# Patient Record
Sex: Male | Born: 1960 | Race: White | Hispanic: No | Smoking: Heavy tobacco smoker
Health system: Southern US, Community
[De-identification: ages and names within clinical notes are randomized; demographics above are authoritative.]

## PROBLEM LIST (undated history)

## (undated) DIAGNOSIS — K7460 Unspecified cirrhosis of liver: Secondary | ICD-10-CM

## (undated) HISTORY — PX: LEG AMPUTATION: SHX1105

## (undated) HISTORY — PX: MR LOWER LEG LEFT (ARMC HX): HXRAD1784

---

## 2005-03-16 ENCOUNTER — Ambulatory Visit (HOSPITAL_COMMUNITY): Admission: RE | Admit: 2005-03-16 | Discharge: 2005-03-16 | Payer: Self-pay | Admitting: General Surgery

## 2009-09-04 ENCOUNTER — Encounter (HOSPITAL_COMMUNITY): Admission: RE | Admit: 2009-09-04 | Discharge: 2009-10-04 | Payer: Self-pay

## 2009-10-07 ENCOUNTER — Encounter (HOSPITAL_COMMUNITY): Admission: RE | Admit: 2009-10-07 | Discharge: 2009-11-06 | Payer: Self-pay | Admitting: Orthopedic Surgery

## 2009-11-07 ENCOUNTER — Encounter (HOSPITAL_COMMUNITY): Admission: RE | Admit: 2009-11-07 | Discharge: 2009-12-07 | Payer: Self-pay | Admitting: Orthopedic Surgery

## 2010-01-02 ENCOUNTER — Ambulatory Visit: Payer: Self-pay

## 2010-02-25 ENCOUNTER — Ambulatory Visit: Payer: Self-pay | Admitting: Orthopedic Surgery

## 2010-04-29 ENCOUNTER — Ambulatory Visit: Payer: Self-pay | Admitting: Family Medicine

## 2011-01-02 NOTE — Op Note (Signed)
NAMEQUSAY, VILLADA                   ACCOUNT NO.:  [id number]   MEDICAL RECORD NO.:  [id number]          PATIENT TYPE:  OIB   LOCATION:  2899                         FACILITY:  [facility]   PHYSICIAN:  Cherylynn Ridges, M.D.    DATE OF BIRTH:  09-09-1960   DATE OF PROCEDURE:  03/16/2005  DATE OF DISCHARGE:                                 OPERATIVE REPORT   PREOPERATIVE DIAGNOSIS:  Umbilical hernia.   POSTOPERATIVE DIAGNOSIS:  Umbilical hernia.   OPERATION/PROCEDURE:  Repair of umbilical hernia with mesh.   SURGEON:  Cherylynn Ridges, M.D.   ASSISTANT:  None.   ANESTHESIA:  General endotracheal anesthesia.   ESTIMATED BLOOD LOSS:  Less than 20 mL.   COMPLICATIONS:  None.   CONDITION:  Stable.   FINDINGS:  The patient had a 2 cm umbilical hernia defect with incarcerated  parietal peritoneal fat.   DISPOSITION:  To PACU, then to home when stable.   INDICATIONS:  The patient is a 50 year old gentleman with a symptomatic  umbilical hernia who now comes in for repair.   DESCRIPTION OF PROCEDURE:  The patient was taken to the operating room,  placed on the table in the supine position.  After an adequate endotracheal  anesthetic was administered, he was prepped and draped in the usual sterile  manner exposing the periumbilical area.   A supraumbilical curvilinear incision was made using #10 blade, taken down  to the subcutaneous tissue.  We then bluntly and sharply dissected off the  hernia defect in the supraumbilical area down to its fascial edge.  This was  done using electrocautery and also Metzenbaum scissors.  Once the edge was  defined, we opened the sac and resected the hernia sac itself, cauterizing  its edge so that it did not bleed significantly.  We then repaired the 2 cm  fascial defect using a running #1 Ethibond suture which came from left to  right across the defect and then back from right to left, tying it to itself  on the left side.  We irrigated the wound with  antibiotic solution and then  we placed a folded piece of  3 x 6 mesh in the wound, attaching it with #1 Prolene.  Excess mesh was  removed with scissors.  We irrigated again with antibiotic solution.  Then  we reapproximated the subcutaneous tissue on top of the mesh using  interrupted 3-0  Vicryl sutures.  Once this was done, we injected the subcutaneous area using  0.5% Marcaine.  A total of 16 mL were used.  We then closed the skin using a  running subcuticular stitch of 4-0 Monocryl.  A sterile dressing was  applied.  Needle count, sponge count and instrument count were correct.      Cherylynn Ridges, M.D.  Electronically Signed     JOW/MEDQ  D:  03/16/2005  T:  03/16/2005  Job:  [id number]

## 2011-01-13 ENCOUNTER — Other Ambulatory Visit: Payer: Self-pay | Admitting: *Deleted

## 2011-01-13 MED ORDER — DILTIAZEM HCL ER BEADS 180 MG PO CP24: 180.0000 mg | ORAL_CAPSULE | Freq: Every day | ORAL | Status: DC

## 2011-02-23 ENCOUNTER — Other Ambulatory Visit: Payer: Self-pay | Admitting: Cardiology

## 2011-07-09 IMAGING — US US EXTREM LOW VENOUS*R*
1 series · 17 of 24 positions shown · non-contrast
Comparison: none

REASON FOR EXAM: swollen leg eval DVT   CR  [phone]
COMMENTS:

[Series 1: us extrem low venous*right* · 17 of 25 slices shown]
[im 1/25]
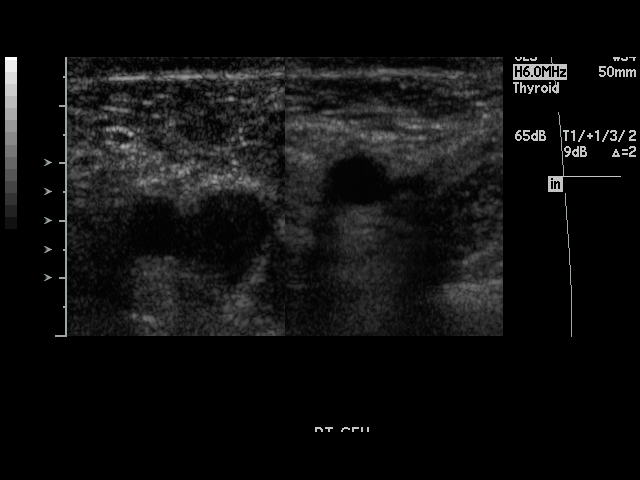
[im 3/25]
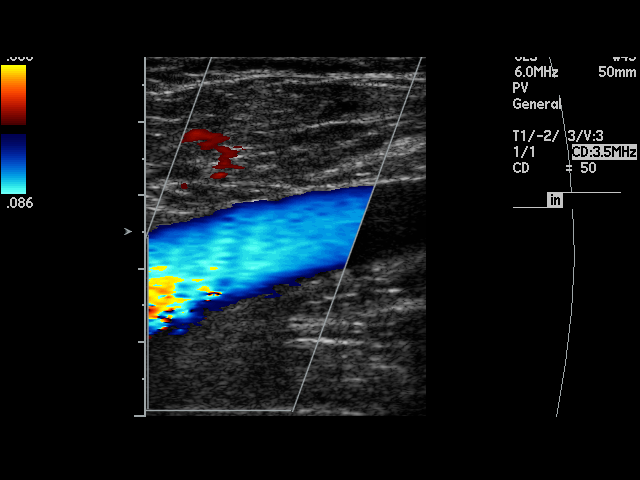
[im 4/25]
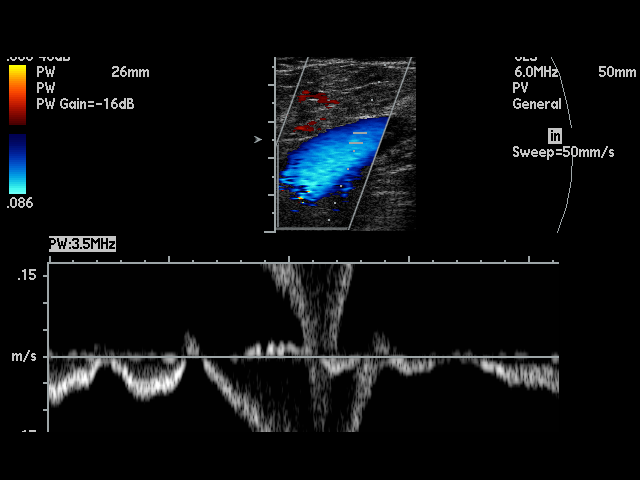
[im 5/25]
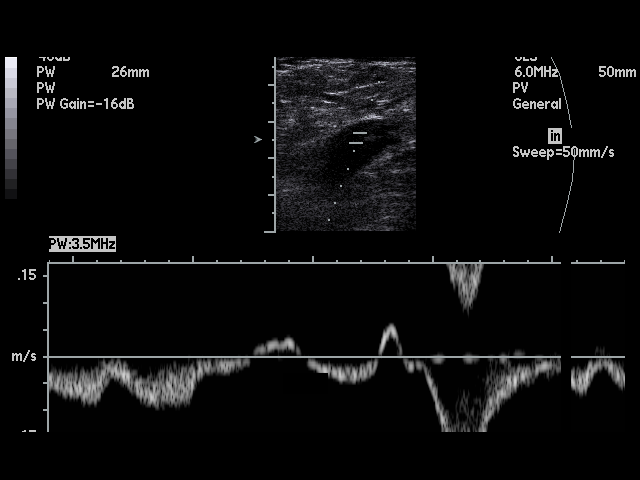
[im 7/25]
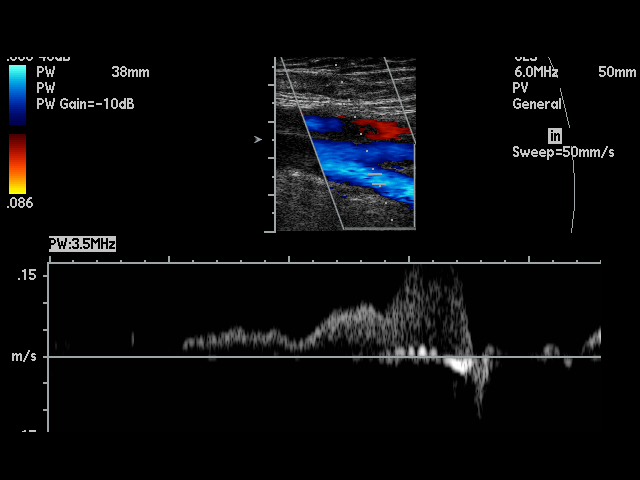
[im 8/25]
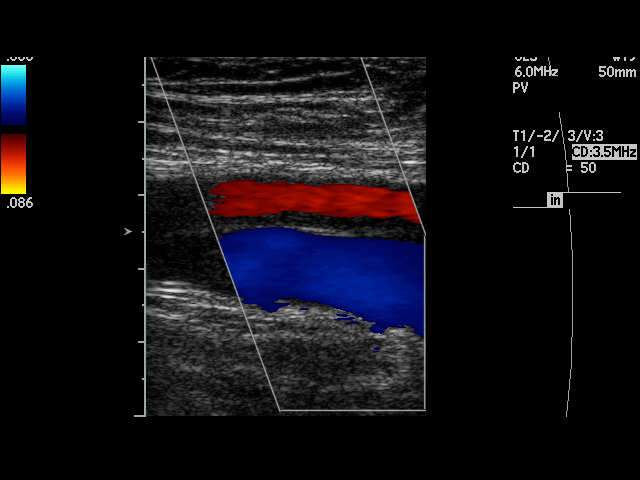
[im 10/25]
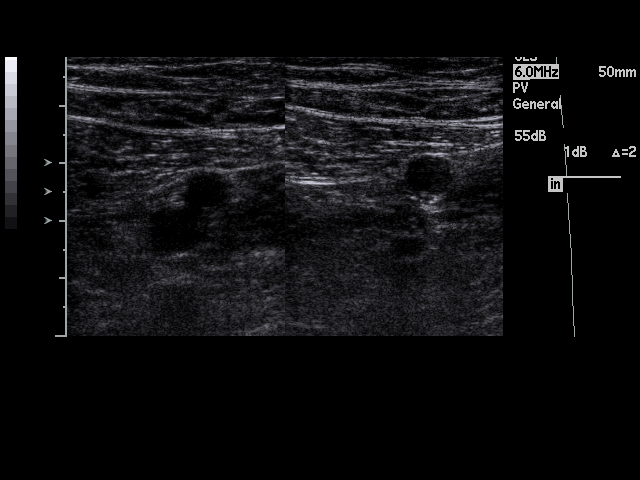
[im 11/25]
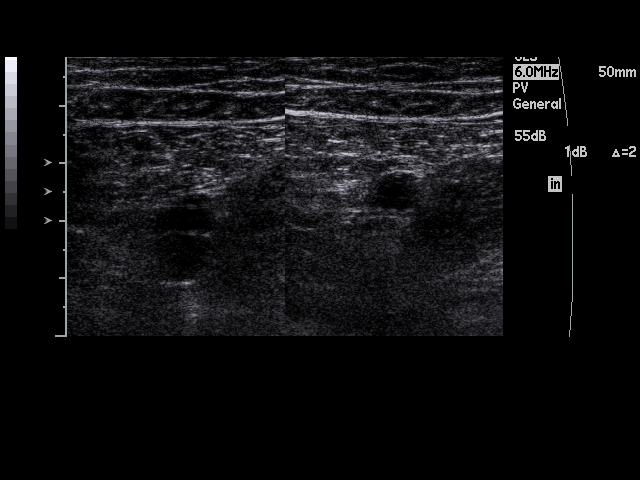
[im 13/25]
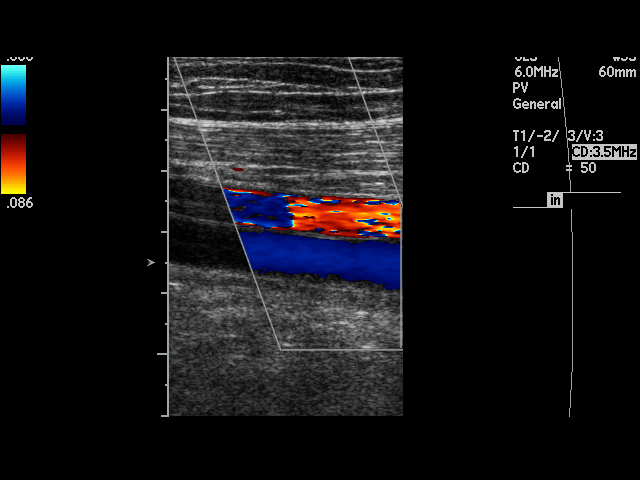
[im 14/25]
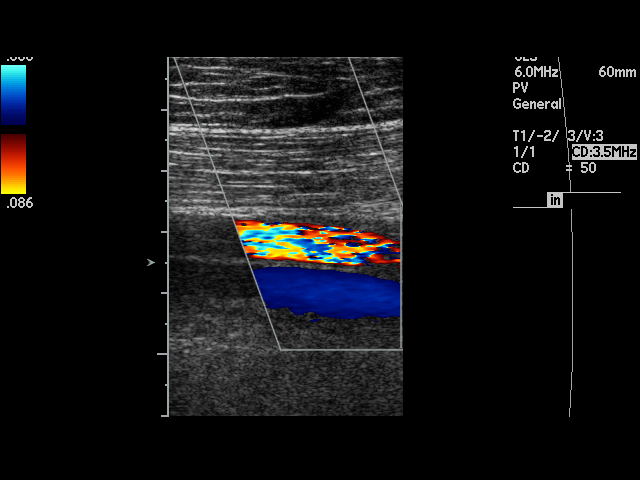
[im 15/25]
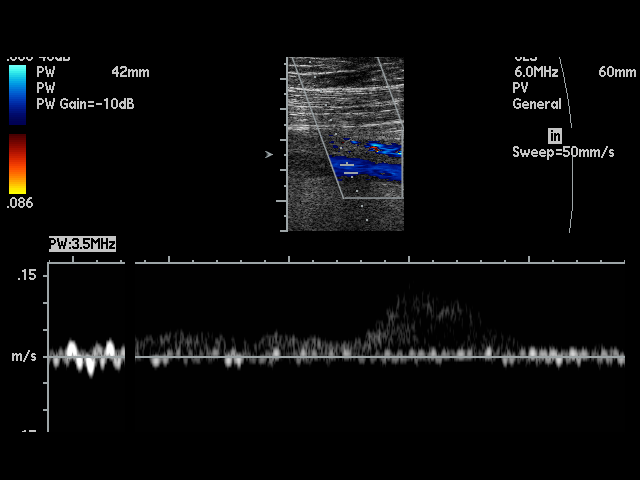
[im 17/25]
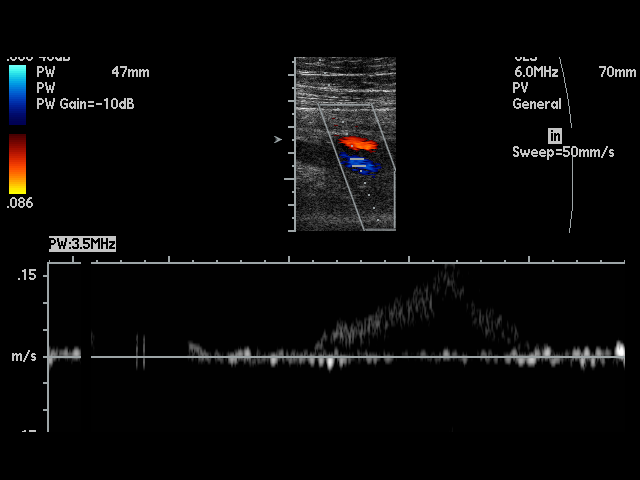
[im 18/25]
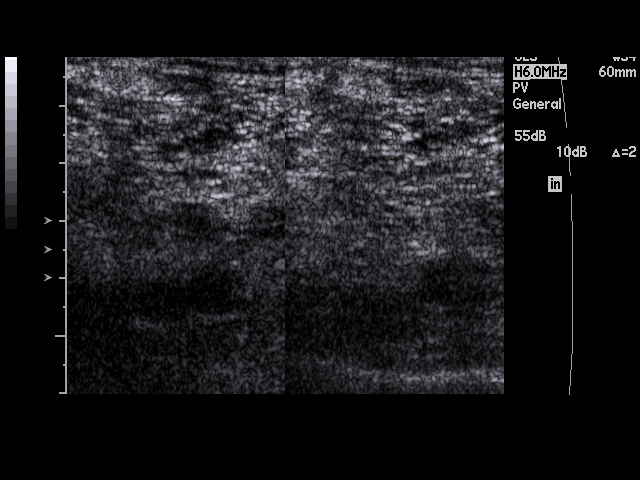
[im 20/25]
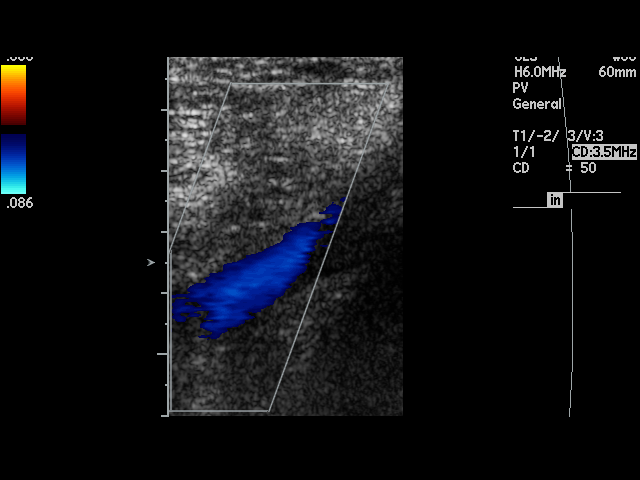
[im 21/25]
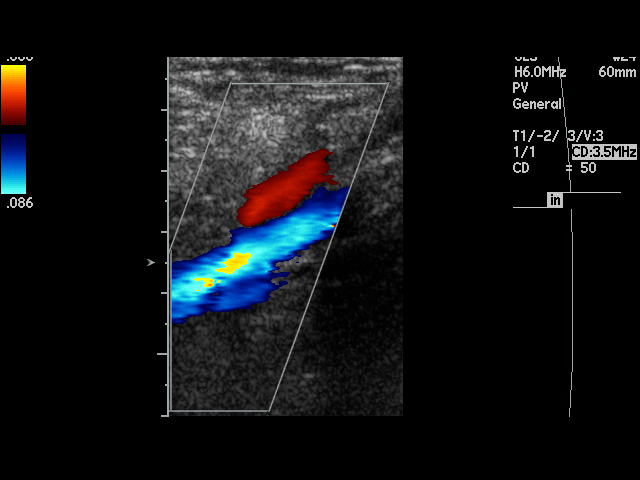
[im 22/25]
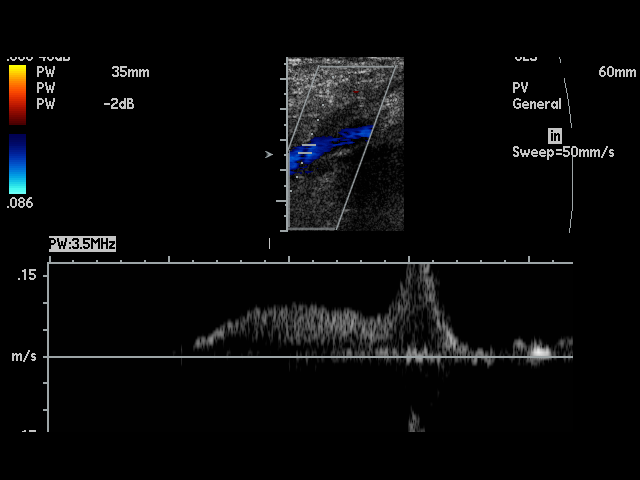
[im 25/25]
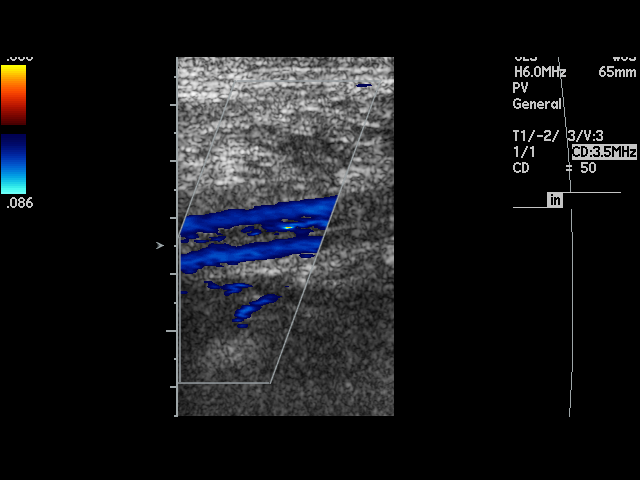

[17 of 24 positions shown; findings below may reference images not displayed]

PROCEDURE:     BAMBUCAFE - BAMBUCAFE DOPPLER LOW EXTR RIGHT  - April 29, 2010 [DATE]

RESULT:     Right lower extremity duplex Doppler interrogation demonstrates
normal compressibility from the common femoral vein to the popliteal veins.
Color Doppler and spectral Doppler appearance is normal. There is normal
response to Valsalva and calf compression.
IMPRESSION: No evidence of right lower extremity deep vein thrombosis.

## 2017-02-24 ENCOUNTER — Emergency Department (HOSPITAL_COMMUNITY): Payer: Medicaid Other

## 2017-02-24 ENCOUNTER — Encounter (HOSPITAL_COMMUNITY): Payer: Self-pay | Admitting: Emergency Medicine

## 2017-02-24 ENCOUNTER — Emergency Department (HOSPITAL_COMMUNITY)
Admission: EM | Admit: 2017-02-24 | Discharge: 2017-02-24 | Disposition: A | Discharge status: Home / Self Care | Payer: Medicaid Other | Attending: Emergency Medicine | Admitting: Emergency Medicine

## 2017-02-24 DIAGNOSIS — M545 Low back pain: Secondary | ICD-10-CM | POA: Diagnosis not present

## 2017-02-24 DIAGNOSIS — I10 Essential (primary) hypertension: Secondary | ICD-10-CM | POA: Diagnosis not present

## 2017-02-24 DIAGNOSIS — F17210 Nicotine dependence, cigarettes, uncomplicated: Secondary | ICD-10-CM | POA: Diagnosis not present

## 2017-02-24 DIAGNOSIS — G8929 Other chronic pain: Secondary | ICD-10-CM

## 2017-02-24 MED ORDER — HYDROCODONE-ACETAMINOPHEN 5-325 MG PO TABS: 1.0000 | ORAL_TABLET | ORAL | 0 refills | Status: DC | PRN

## 2017-02-24 MED ORDER — PREDNISONE 10 MG PO TABS: ORAL_TABLET | ORAL | 0 refills | Status: DC

## 2017-02-24 MED ORDER — LISINOPRIL 10 MG PO TABS: 10.0000 mg | ORAL_TABLET | Freq: Every day | ORAL | 1 refills | Status: DC

## 2017-02-24 MED ORDER — ONDANSETRON 4 MG PO TBDP
4.0000 mg | ORAL_TABLET | Freq: Once | ORAL | Status: AC
Start: 2017-02-24 — End: 2017-02-24
  Administered 2017-02-24: 4 mg via ORAL
  Filled 2017-02-24: qty 1

## 2017-02-24 MED ORDER — PREDNISONE 50 MG PO TABS
60.0000 mg | ORAL_TABLET | Freq: Once | ORAL | Status: AC
  Administered 2017-02-24: 60 mg via ORAL
  Filled 2017-02-24: qty 1

## 2017-02-24 MED ORDER — HYDROMORPHONE HCL 1 MG/ML IJ SOLN
1.0000 mg | Freq: Once | INTRAMUSCULAR | Status: AC
  Administered 2017-02-24: 1 mg via INTRAMUSCULAR
  Filled 2017-02-24: qty 1

## 2017-02-24 MED ORDER — CYCLOBENZAPRINE HCL 10 MG PO TABS: 10.0000 mg | ORAL_TABLET | Freq: Two times a day (BID) | ORAL | 0 refills | Status: DC | PRN

## 2017-02-24 NOTE — ED Triage Notes (Signed)
Patient complains of left lower back pain. Mr. Henry [name] state that he "blew out" the disc on his left lower back.
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
## 2017-02-24 NOTE — Discharge Instructions (Signed)
Take your next dose of prednisone tomorrow evening.  Use the the other medicines as directed.  Do not drive within 4 hours of taking hydrocodone as this will make you drowsy.  Avoid lifting,  Bending,  Twisting or any other activity that worsens your pain over the next week.  Apply an  icepack  to your lower back for 10-15 minutes every 2 hours for the next 2 days.  You should get rechecked if your symptoms are not better over the next 5 days,  Or you develop increased pain,  Weakness in your leg(s) or loss of bladder or bowel function - these are symptoms of a worse injury.

## 2019-05-05 ENCOUNTER — Other Ambulatory Visit: Payer: Self-pay

## 2019-05-05 ENCOUNTER — Emergency Department (HOSPITAL_COMMUNITY): Payer: Medicaid Other

## 2019-05-05 ENCOUNTER — Inpatient Hospital Stay (HOSPITAL_COMMUNITY)
Admission: EM | Admit: 2019-05-05 | Discharge: 2019-05-09 | DRG: 432 | Disposition: A | Discharge status: Home / Self Care | Payer: Medicaid Other | Attending: Internal Medicine | Admitting: Internal Medicine

## 2019-05-05 ENCOUNTER — Encounter (HOSPITAL_COMMUNITY): Payer: Self-pay | Admitting: Emergency Medicine

## 2019-05-05 DIAGNOSIS — K7031 Alcoholic cirrhosis of liver with ascites: Secondary | ICD-10-CM

## 2019-05-05 DIAGNOSIS — K7040 Alcoholic hepatic failure without coma: Principal | ICD-10-CM | POA: Diagnosis present

## 2019-05-05 DIAGNOSIS — I10 Essential (primary) hypertension: Secondary | ICD-10-CM | POA: Diagnosis present

## 2019-05-05 DIAGNOSIS — R1011 Right upper quadrant pain: Secondary | ICD-10-CM

## 2019-05-05 DIAGNOSIS — Z9119 Patient's noncompliance with other medical treatment and regimen: Secondary | ICD-10-CM | POA: Diagnosis not present

## 2019-05-05 DIAGNOSIS — E871 Hypo-osmolality and hyponatremia: Secondary | ICD-10-CM | POA: Diagnosis present

## 2019-05-05 DIAGNOSIS — I4892 Unspecified atrial flutter: Secondary | ICD-10-CM

## 2019-05-05 DIAGNOSIS — Y906 Blood alcohol level of 120-199 mg/100 ml: Secondary | ICD-10-CM | POA: Diagnosis present

## 2019-05-05 DIAGNOSIS — R6511 Systemic inflammatory response syndrome (SIRS) of non-infectious origin with acute organ dysfunction: Secondary | ICD-10-CM | POA: Diagnosis not present

## 2019-05-05 DIAGNOSIS — K7011 Alcoholic hepatitis with ascites: Secondary | ICD-10-CM | POA: Diagnosis present

## 2019-05-05 DIAGNOSIS — R140 Abdominal distension (gaseous): Secondary | ICD-10-CM | POA: Diagnosis present

## 2019-05-05 DIAGNOSIS — T8789 Other complications of amputation stump: Secondary | ICD-10-CM | POA: Diagnosis not present

## 2019-05-05 DIAGNOSIS — F17210 Nicotine dependence, cigarettes, uncomplicated: Secondary | ICD-10-CM | POA: Diagnosis present

## 2019-05-05 DIAGNOSIS — Z23 Encounter for immunization: Secondary | ICD-10-CM

## 2019-05-05 DIAGNOSIS — I998 Other disorder of circulatory system: Secondary | ICD-10-CM | POA: Diagnosis not present

## 2019-05-05 DIAGNOSIS — Z20828 Contact with and (suspected) exposure to other viral communicable diseases: Secondary | ICD-10-CM | POA: Diagnosis present

## 2019-05-05 DIAGNOSIS — I484 Atypical atrial flutter: Secondary | ICD-10-CM | POA: Diagnosis present

## 2019-05-05 DIAGNOSIS — R6510 Systemic inflammatory response syndrome (SIRS) of non-infectious origin without acute organ dysfunction: Secondary | ICD-10-CM | POA: Diagnosis not present

## 2019-05-05 DIAGNOSIS — K529 Noninfective gastroenteritis and colitis, unspecified: Secondary | ICD-10-CM | POA: Diagnosis present

## 2019-05-05 DIAGNOSIS — K652 Spontaneous bacterial peritonitis: Secondary | ICD-10-CM | POA: Diagnosis present

## 2019-05-05 DIAGNOSIS — K7460 Unspecified cirrhosis of liver: Secondary | ICD-10-CM

## 2019-05-05 DIAGNOSIS — M7989 Other specified soft tissue disorders: Secondary | ICD-10-CM | POA: Diagnosis present

## 2019-05-05 DIAGNOSIS — E860 Dehydration: Secondary | ICD-10-CM | POA: Diagnosis present

## 2019-05-05 DIAGNOSIS — F1010 Alcohol abuse, uncomplicated: Secondary | ICD-10-CM | POA: Diagnosis present

## 2019-05-05 DIAGNOSIS — R188 Other ascites: Secondary | ICD-10-CM

## 2019-05-05 DIAGNOSIS — R600 Localized edema: Secondary | ICD-10-CM | POA: Diagnosis present

## 2019-05-05 DIAGNOSIS — Z720 Tobacco use: Secondary | ICD-10-CM

## 2019-05-05 HISTORY — DX: Rider (driver) (passenger) of other motorcycle injured in unspecified traffic accident, initial encounter: V29.99XA

## 2019-05-05 LAB — URINALYSIS, ROUTINE W REFLEX MICROSCOPIC
Glucose, UA: NEGATIVE mg/dL
Hgb urine dipstick: NEGATIVE
Ketones, ur: NEGATIVE mg/dL
Leukocytes,Ua: NEGATIVE
Nitrite: NEGATIVE
Protein, ur: NEGATIVE mg/dL
Specific Gravity, Urine: >1.046 — ABNORMAL HIGH (ref 1.005–1.030)
pH: 6 (ref 5.0–8.0)

## 2019-05-05 LAB — COMPREHENSIVE METABOLIC PANEL
ALT: 76 U/L — ABNORMAL HIGH (ref 0–44)
AST: 219 U/L — ABNORMAL HIGH (ref 15–41)
Albumin: 2.7 g/dL — ABNORMAL LOW (ref 3.5–5.0)
Alkaline Phosphatase: 407 U/L — ABNORMAL HIGH (ref 38–126)
Anion gap: 13 (ref 5–15)
BUN: 6 mg/dL (ref 6–20)
CO2: 22 mmol/L (ref 22–32)
Calcium: 8 mg/dL — ABNORMAL LOW (ref 8.9–10.3)
Chloride: 98 mmol/L (ref 98–111)
Creatinine, Ser: 0.56 mg/dL — ABNORMAL LOW (ref 0.61–1.24)
GFR calc Af Amer: >60 mL/min (ref >60)
GFR calc non Af Amer: >60 mL/min (ref >60)
Glucose, Bld: 118 mg/dL — ABNORMAL HIGH (ref 70–99)
Potassium: 3.7 mmol/L (ref 3.5–5.1)
Sodium: 133 mmol/L — ABNORMAL LOW (ref 135–145)
Total Bilirubin: 4.8 mg/dL — ABNORMAL HIGH (ref 0.3–1.2)
Total Protein: 7.3 g/dL (ref 6.5–8.1)

## 2019-05-05 LAB — CBC WITH DIFFERENTIAL/PLATELET
Abs Immature Granulocytes: 0.04 10*3/uL (ref 0.00–0.07)
Basophils Absolute: 0.1 10*3/uL (ref 0.0–0.1)
Basophils Relative: 1 %
Eosinophils Absolute: 0 10*3/uL (ref 0.0–0.5)
Eosinophils Relative: 0 %
HCT: 37.3 % — ABNORMAL LOW (ref 39.0–52.0)
Hemoglobin: 13.3 g/dL (ref 13.0–17.0)
Immature Granulocytes: 0 %
Lymphocytes Relative: 17 %
Lymphs Abs: 2 10*3/uL (ref 0.7–4.0)
MCH: 39.7 pg — ABNORMAL HIGH (ref 26.0–34.0)
MCHC: 35.7 g/dL (ref 30.0–36.0)
MCV: 111.3 fL — ABNORMAL HIGH (ref 80.0–100.0)
Monocytes Absolute: 0.9 10*3/uL (ref 0.1–1.0)
Monocytes Relative: 7 %
Neutro Abs: 9.2 10*3/uL — ABNORMAL HIGH (ref 1.7–7.7)
Neutrophils Relative %: 75 %
Platelets: 223 10*3/uL (ref 150–400)
RBC: 3.35 MIL/uL — ABNORMAL LOW (ref 4.22–5.81)
RDW: 18.3 % — ABNORMAL HIGH (ref 11.5–15.5)
WBC: 12.3 10*3/uL — ABNORMAL HIGH (ref 4.0–10.5)
nRBC: 0.3 % — ABNORMAL HIGH (ref 0.0–0.2)

## 2019-05-05 LAB — SALICYLATE LEVEL: Salicylate Lvl: <7 mg/dL (ref 2.8–30.0)

## 2019-05-05 LAB — AMMONIA: Ammonia: 59 umol/L — ABNORMAL HIGH (ref 9–35)

## 2019-05-05 LAB — ACETAMINOPHEN LEVEL: Acetaminophen (Tylenol), Serum: <10 ug/mL — ABNORMAL LOW (ref 10–30)

## 2019-05-05 LAB — ETHANOL: Alcohol, Ethyl (B): 173 mg/dL — ABNORMAL HIGH (ref <10)

## 2019-05-05 LAB — HEPARIN LEVEL (UNFRACTIONATED): Heparin Unfractionated: 0.16 IU/mL — ABNORMAL LOW (ref 0.30–0.70)

## 2019-05-05 LAB — PROTIME-INR
INR: 1.3 — ABNORMAL HIGH (ref 0.8–1.2)
Prothrombin Time: 16.2 seconds — ABNORMAL HIGH (ref 11.4–15.2)

## 2019-05-05 LAB — LIPASE, BLOOD: Lipase: 37 U/L (ref 11–51)

## 2019-05-05 LAB — LACTIC ACID, PLASMA
Lactic Acid, Venous: 4 mmol/L (ref 0.5–1.9)
Lactic Acid, Venous: 3.9 mmol/L (ref 0.5–1.9)

## 2019-05-05 MED ORDER — LORAZEPAM 2 MG/ML IJ SOLN
1.0000 mg | INTRAMUSCULAR | Status: AC | PRN
  Administered 2019-05-07: 1 mg via INTRAVENOUS

## 2019-05-05 MED ORDER — LORAZEPAM 1 MG PO TABS: 1.0000 mg | ORAL_TABLET | ORAL | Status: AC | PRN

## 2019-05-05 MED ORDER — LORAZEPAM 2 MG/ML IJ SOLN
0.0000 mg | INTRAMUSCULAR | Status: AC
  Administered 2019-05-05 – 2019-05-06 (×4): 1 mg via INTRAVENOUS
  Filled 2019-05-05 (×4): qty 1

## 2019-05-05 MED ORDER — IOHEXOL 300 MG/ML  SOLN
100.0000 mL | Freq: Once | INTRAMUSCULAR | Status: AC | PRN
  Administered 2019-05-05: 100 mL via INTRAVENOUS

## 2019-05-05 MED ORDER — HEPARIN BOLUS VIA INFUSION
4000.0000 [IU] | Freq: Once | INTRAVENOUS | Status: AC
  Administered 2019-05-05: 4000 [IU] via INTRAVENOUS

## 2019-05-05 MED ORDER — ADULT MULTIVITAMIN W/MINERALS CH
1.0000 | ORAL_TABLET | Freq: Every day | ORAL | Status: DC
  Administered 2019-05-05 – 2019-05-09 (×5): 1 via ORAL
  Filled 2019-05-05 (×5): qty 1

## 2019-05-05 MED ORDER — AMIODARONE IV BOLUS ONLY 150 MG/100ML
150.0000 mg | Freq: Once | INTRAVENOUS | Status: AC
  Administered 2019-05-05: 150 mg via INTRAVENOUS
  Filled 2019-05-05: qty 100

## 2019-05-05 MED ORDER — SODIUM CHLORIDE 0.9 % IV SOLN
2.0000 g | Freq: Three times a day (TID) | INTRAVENOUS | Status: DC
  Administered 2019-05-05 – 2019-05-06 (×2): 2 g via INTRAVENOUS
  Filled 2019-05-05 (×2): qty 2

## 2019-05-05 MED ORDER — VITAMIN B-1 100 MG PO TABS
100.0000 mg | ORAL_TABLET | Freq: Every day | ORAL | Status: DC
  Administered 2019-05-05 – 2019-05-09 (×5): 100 mg via ORAL
  Filled 2019-05-05 (×5): qty 1

## 2019-05-05 MED ORDER — DILTIAZEM LOAD VIA INFUSION
10.0000 mg | Freq: Once | INTRAVENOUS | Status: AC
  Administered 2019-05-05: 10 mg via INTRAVENOUS
  Filled 2019-05-05: qty 10

## 2019-05-05 MED ORDER — VANCOMYCIN HCL IN DEXTROSE 750-5 MG/150ML-% IV SOLN
750.0000 mg | Freq: Three times a day (TID) | INTRAVENOUS | Status: DC
  Administered 2019-05-06 (×2): 750 mg via INTRAVENOUS
  Filled 2019-05-05 (×2): qty 150

## 2019-05-05 MED ORDER — SODIUM CHLORIDE 0.9 % IV BOLUS
500.0000 mL | Freq: Once | INTRAVENOUS | Status: AC
  Administered 2019-05-05: 500 mL via INTRAVENOUS

## 2019-05-05 MED ORDER — DILTIAZEM HCL 100 MG IV SOLR
5.0000 mg/h | INTRAVENOUS | Status: DC
  Administered 2019-05-05: 15 mg/h via INTRAVENOUS
  Administered 2019-05-05: 5 mg/h via INTRAVENOUS
  Administered 2019-05-06: 15 mg/h via INTRAVENOUS
  Filled 2019-05-05 (×4): qty 100

## 2019-05-05 MED ORDER — THIAMINE HCL 100 MG/ML IJ SOLN: 100.0000 mg | Freq: Every day | INTRAMUSCULAR | Status: DC

## 2019-05-05 MED ORDER — HEPARIN (PORCINE) 25000 UT/250ML-% IV SOLN
1300.0000 [IU]/h | INTRAVENOUS | Status: DC
  Administered 2019-05-05: 1100 [IU]/h via INTRAVENOUS
  Filled 2019-05-05 (×2): qty 250

## 2019-05-05 MED ORDER — VANCOMYCIN HCL IN DEXTROSE 1-5 GM/200ML-% IV SOLN: 1000.0000 mg | Freq: Once | INTRAVENOUS | Status: DC

## 2019-05-05 MED ORDER — ONDANSETRON HCL 4 MG/2ML IJ SOLN: 4.0000 mg | Freq: Four times a day (QID) | INTRAMUSCULAR | Status: DC | PRN

## 2019-05-05 MED ORDER — METRONIDAZOLE IN NACL 5-0.79 MG/ML-% IV SOLN
500.0000 mg | Freq: Once | INTRAVENOUS | Status: AC
  Administered 2019-05-05: 500 mg via INTRAVENOUS
  Filled 2019-05-05: qty 100

## 2019-05-05 MED ORDER — LORAZEPAM 2 MG/ML IJ SOLN
0.0000 mg | Freq: Three times a day (TID) | INTRAMUSCULAR | Status: DC
  Filled 2019-05-05 (×2): qty 1

## 2019-05-05 MED ORDER — SODIUM CHLORIDE 0.9 % IV SOLN
2.0000 g | Freq: Once | INTRAVENOUS | Status: AC
  Administered 2019-05-05: 2 g via INTRAVENOUS
  Filled 2019-05-05: qty 2

## 2019-05-05 MED ORDER — HEPARIN BOLUS VIA INFUSION
3000.0000 [IU] | Freq: Once | INTRAVENOUS | Status: AC
  Administered 2019-05-05: 3000 [IU] via INTRAVENOUS

## 2019-05-05 MED ORDER — FOLIC ACID 1 MG PO TABS
1.0000 mg | ORAL_TABLET | Freq: Every day | ORAL | Status: DC
  Administered 2019-05-05 – 2019-05-09 (×5): 1 mg via ORAL
  Filled 2019-05-05 (×4): qty 1

## 2019-05-05 MED ORDER — VANCOMYCIN HCL 1.5 G IV SOLR
1500.0000 mg | Freq: Once | INTRAVENOUS | Status: AC
  Administered 2019-05-05: 1500 mg via INTRAVENOUS
  Filled 2019-05-05: qty 1500

## 2019-05-05 MED ORDER — LACTULOSE 10 GM/15ML PO SOLN
20.0000 g | Freq: Two times a day (BID) | ORAL | Status: DC
  Administered 2019-05-05 – 2019-05-08 (×7): 20 g via ORAL
  Filled 2019-05-05 (×8): qty 30

## 2019-05-05 MED ORDER — ONDANSETRON HCL 4 MG PO TABS: 4.0000 mg | ORAL_TABLET | Freq: Four times a day (QID) | ORAL | Status: DC | PRN

## 2019-05-05 NOTE — Progress Notes (Signed)
Pharmacy Antibiotic Note  Henry [name] is a 58 y.o. male admitted on 05/05/2019 with unknown source of infection.  Pharmacy has been consulted for Vancomycin and cefepime dosing.  Plan: Vancomycin 1500mg  IV loading dose, then 750mg  IV every 8 hours.  Goal trough 15-20 mcg/mL.  Cefepime 2gm IV q8h F/U cxs and clinical progress Monitor V/S, labs and levels as indicated  Height: 5\' 8"  (172.7 cm) Weight: 175 lb (79.4 kg) IBW/kg (Calculated) : 68.4  Temp (24hrs), Avg:98.3 F (36.8 C), Min:98.3 F (36.8 C), Max:98.3 F (36.8 C)  Recent Labs  Lab 05/05/19 1327 05/05/19 1328  WBC 12.3*  --   CREATININE 0.56*  --   LATICACIDVEN  --  4.0*    Estimated Creatinine Clearance: 97.4 mL/min (A) (by C-G formula based on SCr of 0.56 mg/dL (L)).    No Known Allergies  Antimicrobials this admission: Vancomycin 9/18>>  Cefepime 9/18 >>   Dose adjustments this admission: n/a  Microbiology results: 9/18 BCx: pending 9/18 UCx: pending  MRSA PCR:   Thank you for allowing pharmacy to be a part of this patient's care.  Isac Sarna, BS [place], [place] Clinical Pharmacist Pager [phone] 05/05/2019 2:58 PM

## 2019-05-05 NOTE — Progress Notes (Signed)
CSW received call from nursing staff inquiring about pts discharge plan. CSW attempted to contact  [facility] without success in an attempt to obtain caseworker or guardian information to assists with placement process. Staff at [facility] provided [place] with the following contact information: [place]: [phone]. CSW left VM requesting call back.   [place] Transitions [facility] Care  Clinical Social Worker  Ph: [phone]
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
## 2019-05-05 NOTE — ED Notes (Signed)
Patient had bowel movement (diarrhea), patient pooped on bedside commode and the floor.

## 2019-05-05 NOTE — ED Notes (Signed)
Pt transported to CT ?

## 2019-05-05 NOTE — ED Notes (Signed)
CRITICAL VALUE ALERT  Critical Value:  Lactic 4.0  Date & Time Notied:  05/05/2019, 1421 Provider Notified: Dr. Laverta Baltimore  Orders Received/Actions taken: see chart

## 2019-05-05 NOTE — ED Notes (Signed)
Hospitalist aware that first ABT adm prior to blood cultures being drawn
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
## 2019-05-05 NOTE — ED Notes (Signed)
Patient had small bowel movement. Stool looks jelly like and has a foul smell.

## 2019-05-05 NOTE — ED Notes (Signed)
Patient called to use bedside commode, patient had loose watery diarrhea.

## 2019-05-05 NOTE — ED Triage Notes (Addendum)
PT c/o abdominal distention, swelling into his legs and groin worsening over the past 3 weeks. PT states over the past 2-3 days swelling has gotten worsen and now causing some SOB on exertion. PT states he doesn't have  PCP and takes no medications. PT also states increased number of bowel movements and color of stool is a whitish color.

## 2019-05-05 NOTE — Progress Notes (Signed)
ANTICOAGULATION CONSULT NOTE - Follow Up Consult  Pharmacy Consult for heparin Indication: atrial fibrillation  Labs: Recent Labs    05/05/19 1327 05/05/19 2240  HGB 13.3  --   HCT 37.3*  --   PLT 223  --   LABPROT 16.2*  --   INR 1.3*  --   HEPARINUNFRC  --  0.16*  CREATININE 0.56*  --     Assessment: 58yo male subtherapeutic on heparin with initial dosing for Afib; no gtt issues or signs of bleeding per RN.  Goal of Therapy:  Heparin level 0.3-0.7 units/ml   Plan:  Will rebolus with heparin 3000 units and increase heparin gtt by 3 units/kg/hr to 1300 units/hr and check level in 6 hours.    Wynona Neat, PharmD, BCPS  05/05/2019,11:33 PM

## 2019-05-06 ENCOUNTER — Inpatient Hospital Stay (HOSPITAL_COMMUNITY): Payer: Medicaid Other

## 2019-05-06 DIAGNOSIS — I998 Other disorder of circulatory system: Secondary | ICD-10-CM

## 2019-05-06 DIAGNOSIS — F17210 Nicotine dependence, cigarettes, uncomplicated: Secondary | ICD-10-CM

## 2019-05-06 DIAGNOSIS — T8789 Other complications of amputation stump: Secondary | ICD-10-CM

## 2019-05-06 DIAGNOSIS — K652 Spontaneous bacterial peritonitis: Secondary | ICD-10-CM

## 2019-05-06 DIAGNOSIS — R6510 Systemic inflammatory response syndrome (SIRS) of non-infectious origin without acute organ dysfunction: Secondary | ICD-10-CM

## 2019-05-06 LAB — AMYLASE, PLEURAL OR PERITONEAL FLUID: Amylase, Fluid: 10 U/L

## 2019-05-06 LAB — CBC
HCT: 32.8 % — ABNORMAL LOW (ref 39.0–52.0)
Hemoglobin: 11.4 g/dL — ABNORMAL LOW (ref 13.0–17.0)
MCH: 38.5 pg — ABNORMAL HIGH (ref 26.0–34.0)
MCHC: 34.8 g/dL (ref 30.0–36.0)
MCV: 110.8 fL — ABNORMAL HIGH (ref 80.0–100.0)
Platelets: 213 10*3/uL (ref 150–400)
RBC: 2.96 MIL/uL — ABNORMAL LOW (ref 4.22–5.81)
RDW: 18.4 % — ABNORMAL HIGH (ref 11.5–15.5)
WBC: 14.3 10*3/uL — ABNORMAL HIGH (ref 4.0–10.5)
nRBC: 0.1 % (ref 0.0–0.2)

## 2019-05-06 LAB — ALBUMIN, PLEURAL OR PERITONEAL FLUID: Albumin, Fluid: <1 g/dL

## 2019-05-06 LAB — COMPREHENSIVE METABOLIC PANEL
ALT: 64 U/L — ABNORMAL HIGH (ref 0–44)
AST: 183 U/L — ABNORMAL HIGH (ref 15–41)
Albumin: 2.5 g/dL — ABNORMAL LOW (ref 3.5–5.0)
Alkaline Phosphatase: 347 U/L — ABNORMAL HIGH (ref 38–126)
Anion gap: 11 (ref 5–15)
BUN: 7 mg/dL (ref 6–20)
CO2: 22 mmol/L (ref 22–32)
Calcium: 7.8 mg/dL — ABNORMAL LOW (ref 8.9–10.3)
Chloride: 99 mmol/L (ref 98–111)
Creatinine, Ser: 0.61 mg/dL (ref 0.61–1.24)
GFR calc Af Amer: >60 mL/min (ref >60)
GFR calc non Af Amer: >60 mL/min (ref >60)
Glucose, Bld: 126 mg/dL — ABNORMAL HIGH (ref 70–99)
Potassium: 4 mmol/L (ref 3.5–5.1)
Sodium: 132 mmol/L — ABNORMAL LOW (ref 135–145)
Total Bilirubin: 6.6 mg/dL — ABNORMAL HIGH (ref 0.3–1.2)
Total Protein: 6.6 g/dL (ref 6.5–8.1)

## 2019-05-06 LAB — BODY FLUID CELL COUNT WITH DIFFERENTIAL
Eos, Fluid: 0 %
Lymphs, Fluid: 19 %
Monocyte-Macrophage-Serous Fluid: 74 % (ref 50–90)
Neutrophil Count, Fluid: 7 % (ref 0–25)
Total Nucleated Cell Count, Fluid: 209 cu mm (ref 0–1000)

## 2019-05-06 LAB — PROTEIN, PLEURAL OR PERITONEAL FLUID: Total protein, fluid: <3 g/dL

## 2019-05-06 LAB — GLUCOSE, PLEURAL OR PERITONEAL FLUID: Glucose, Fluid: 134 mg/dL

## 2019-05-06 LAB — HEPATITIS PANEL, ACUTE
HCV Ab: 0.1 s/co ratio (ref 0.0–0.9)
Hep A IgM: NEGATIVE
Hep B C IgM: NEGATIVE
Hepatitis B Surface Ag: NEGATIVE

## 2019-05-06 LAB — HEPARIN LEVEL (UNFRACTIONATED)
Heparin Unfractionated: 0.38 IU/mL (ref 0.30–0.70)
Heparin Unfractionated: 0.16 IU/mL — ABNORMAL LOW (ref 0.30–0.70)

## 2019-05-06 LAB — LACTATE DEHYDROGENASE, PLEURAL OR PERITONEAL FLUID: LD, Fluid: <5 U/L (ref 3–23)

## 2019-05-06 LAB — POC OCCULT BLOOD, ED: Fecal Occult Bld: NEGATIVE

## 2019-05-06 LAB — SARS CORONAVIRUS 2 (TAT 6-24 HRS): SARS Coronavirus 2: NEGATIVE

## 2019-05-06 LAB — GLUCOSE, CAPILLARY: Glucose-Capillary: 119 mg/dL — ABNORMAL HIGH (ref 70–99)

## 2019-05-06 MED ORDER — HEPARIN (PORCINE) 25000 UT/250ML-% IV SOLN
1700.0000 [IU]/h | INTRAVENOUS | Status: DC
  Administered 2019-05-07: 05:00:00 1700 [IU]/h via INTRAVENOUS
  Filled 2019-05-06: qty 250

## 2019-05-06 MED ORDER — NICOTINE 21 MG/24HR TD PT24
21.0000 mg | MEDICATED_PATCH | Freq: Every day | TRANSDERMAL | Status: DC
  Administered 2019-05-06 – 2019-05-09 (×4): 21 mg via TRANSDERMAL
  Filled 2019-05-06 (×4): qty 1

## 2019-05-06 MED ORDER — LIDOCAINE HCL (PF) 1 % IJ SOLN
INTRAMUSCULAR | Status: AC
  Filled 2019-05-06: qty 30

## 2019-05-06 MED ORDER — DILTIAZEM HCL 60 MG PO TABS
90.0000 mg | ORAL_TABLET | Freq: Four times a day (QID) | ORAL | Status: AC
  Administered 2019-05-06 – 2019-05-08 (×7): 90 mg via ORAL
  Filled 2019-05-06: qty 2
  Filled 2019-05-06: qty 3
  Filled 2019-05-06 (×5): qty 2

## 2019-05-06 MED ORDER — LEVOFLOXACIN IN D5W 750 MG/150ML IV SOLN
750.0000 mg | INTRAVENOUS | Status: DC
  Administered 2019-05-06: 750 mg via INTRAVENOUS
  Filled 2019-05-06 (×2): qty 150

## 2019-05-06 NOTE — ED Notes (Signed)
Have discontinued Cardizem

## 2019-05-06 NOTE — ED Notes (Signed)
Report to Ayesha Rumpf with [facility].
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
## 2019-05-06 NOTE — Procedures (Signed)
PROCEDURE SUMMARY:  Successful image-guided paracentesis from the left lower abdomen.  Yielded 1.5 liters of clear yellow fluid.  No immediate complications.  EBL: zero Patient tolerated well.   Specimen was sent for labs.  Please see imaging section of Epic for full dictation.  Joaquim Nam PA-C 05/06/2019 3:41 PM

## 2019-05-06 NOTE — Progress Notes (Addendum)
PROGRESS NOTE    Henry [name]  ZCH:[id number] DOB: 07-Aug-1961 DOA: 05/05/2019 PCP: Patient, No Pcp Per     Brief Narrative:  58 y.o. male, with past medical history of hypertension, traumatic left lower extremity amputation, heavy alcohol abuse, he presents to ED secondary to complaints of realized weakness, abdominal pain/distention and swelling for last 3 weeks, as well reports exertional dyspnea, and worsening lower extremity edema, patient reports he drinks 1 pint of whiskey per day, for last few years, patient denies fever, chills, dysuria, polyuria, cough, COVID-19 exposure, diarrhea or constipation, no hematemesis, vomiting or bright red blood per rectum or melena. - in ED patient was noticed to be in a flutter, he did report some palpitation, but denies any chest pain, he was noted to have significant ascites, +2 lower extremity edema, evaded lactic acid at 4, and elevated AST to 19, ALT 76, with total bili of 4.8, CT abdomen pelvis significant for hepatomegaly/steatosis, with ascites, and inflammatory changes appreciated hepatic flexure/cecum.   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 -Stable renal function -No active withdrawal currently -Patient will be transferred to [facility] for large volume paracentesis and GI evaluation while there -Elevated LFTs most likely in the setting of alcoholic hepatitis; follow trend. -alcohol cessation provided.  2-alcohol abuse -cessation counseling provided -started on CIWA protocol  -continue thiamine and folic acid.  3-hyperammonemia  -continue lactulose  4-atrial flutter with RVR -most likely triggered by alcohol abuse, intravascular dehydration and electrolytes abnormalities -started on Cardizem and amiodarone X1 given -Continue electrolytes repletion; specifically try to keep magnesium above 2 and potassium above 4. -At this moment patient rate is  controlled and is into sinus rhythm.  Will attempt transition to oral Cardizem -Patient will be transferred to [facility] for cardiology will be consulted for further evaluation and management. -Has been started on heparin drip as per discussion with Dr. Bronson Ing on 9/18  5-tobacco abuse -I have discussed tobacco cessation with the patient.  I have counseled the patient regarding the negative impacts of continued tobacco use including but not limited to lung cancer, COPD, and cardiovascular disease.  I have discussed alternatives to tobacco and modalities that may help facilitate tobacco cessation including but not limited to biofeedback, hypnosis, and medications.  Total time spent with tobacco counseling was 4 minutes -started on nicotine patch.  6-SIRS/SBP -Follow ascitic fluid results -No frank source of sepsis appreciated -CT abdomen and pelvis inflammatory changes most likely secondary to edema from ongoing ascites and liver steatosis -Patient denies diarrhea or frank abdominal pain/nausea or vomiting. -Will narrow antibiotics to Levaquin. -Continue IV fluids.   DVT prophylaxis: Heparin drip Code Status: Full code Family Communication: no family at bedside  Disposition Plan: Awaiting bed at [facility] to be transferred for further evaluation and management.  Remains inpatient.  Will attempt transition of Cardizem drip.  Still requiring progressive unit.  Will narrow antibiotics and continue the use of heparin drip.  Consultants:   Interventional radiologist (Dr. Butch Penny)  Cardiology service (Dr. Harrell Gave )  Gastroenterology service [facility], Dr. Sunny Schlein)  Procedures:   See below for x-ray reports.  Antimicrobials:  Anti-infectives (From admission, onward)   Start     Dose/Rate Route Frequency Ordered Stop   05/06/19 0000  vancomycin (VANCOCIN) IVPB 750 mg/150 ml premix     750 mg 150 mL/hr over 60 Minutes Intravenous Every 8 hours 05/05/19 1505      05/05/19  2300  ceFEPIme (MAXIPIME) 2 g in sodium chloride 0.9 % 100 mL IVPB     2 g 200 mL/hr over 30 Minutes Intravenous Every 8 hours 05/05/19 1505     05/05/19 1500  vancomycin (VANCOCIN) 1,500 mg in sodium chloride 0.9 % 500 mL IVPB     1,500 mg 250 mL/hr over 120 Minutes Intravenous  Once 05/05/19 1434 05/05/19 1936   05/05/19 1430  ceFEPIme (MAXIPIME) 2 g in sodium chloride 0.9 % 100 mL IVPB     2 g 200 mL/hr over 30 Minutes Intravenous  Once 05/05/19 1427 05/05/19 1543   05/05/19 1430  metroNIDAZOLE (FLAGYL) IVPB 500 mg     500 mg 100 mL/hr over 60 Minutes Intravenous  Once 05/05/19 1427 05/05/19 1823   05/05/19 1430  vancomycin (VANCOCIN) IVPB 1000 mg/200 mL premix  Status:  Discontinued     1,000 mg 200 mL/hr over 60 Minutes Intravenous  Once 05/05/19 1427 05/05/19 1434       Subjective: Patient is afebrile currently, no nausea, no vomiting.  Denies chest pain.  Expressed having difficulty breathing due to lack of chest expansion with diffuse ascites.  Positive abdominal distention/tightness and some discomfort on palpation.  Objective: Vitals:   05/06/19 0900 05/06/19 0930 05/06/19 1000 05/06/19 1030  BP: (!) 136/94 (!) 128/96 (!) 125/93 118/88  Pulse: (!) 101 95 98 92  Resp: (!) 29 (!) 22 (!) 25 (!) 21  Temp:      TempSrc:      SpO2: 93% 91% 94% 92%  Weight:      Height:        Intake/Output Summary (Last 24 hours) at 05/06/2019 1038 Last data filed at 05/06/2019 0236 Gross per 24 hour  Intake 2250 ml  Output 500 ml  Net 1750 ml   Filed Weights   05/05/19 1305  Weight: 79.4 kg    Examination: General exam: Alert, awake, oriented x 3; no chest pain, no nausea, no vomiting.  Patient is afebrile.  Reports feeling abdominal tightness and mild difficulty breathing because lack of chest expansion. Respiratory system: No crackles, no wheezing, positive rhonchi. Positive tachypnea.  No using accessory muscles.   Cardiovascular system: Currently rate control and  cyanosis; no rubs, no gallops, no murmurs on exam.   Gastrointestinal system: Abdomen is distended, soft and slightly tender to palpation.  Positive bowel sounds.  Diffuse hepatomegaly and positive ascites with positive fluid wave sign on examination.   Central nervous system: Alert and oriented. No focal neurological deficits. Extremities: No cyanosis or clubbing.  Left BKA.  Right lower extremity with 2+ edema appreciated on exam. Skin: No open wounds, no petechiae. Psychiatry: Judgement and insight appear normal. Mood & affect appropriate.    Data Reviewed: I have personally reviewed following labs and imaging studies  CBC: Recent Labs  Lab 05/05/19 1327 05/06/19 0532  WBC 12.3* 14.3*  NEUTROABS 9.2*  --   HGB 13.3 11.4*  HCT 37.3* 32.8*  MCV 111.3* 110.8*  PLT 223 213   Basic Metabolic Panel: Recent Labs  Lab 05/05/19 1327 05/06/19 0532  NA 133* 132*  K 3.7 4.0  CL 98 99  CO2 22 22  GLUCOSE 118* 126*  BUN 6 7  CREATININE 0.56* 0.61  CALCIUM 8.0* 7.8*   GFR: Estimated Creatinine Clearance: 97.4 mL/min (by C-G formula based on SCr of 0.61 mg/dL).   Liver Function Tests: Recent Labs  Lab 05/05/19 1327 05/06/19 0532  AST 219* 183*  ALT 76* 64*  ALKPHOS 407* 347*  BILITOT 4.8* 6.6*  PROT 7.3 6.6  ALBUMIN 2.7* 2.5*   Recent Labs  Lab 05/05/19 1327  LIPASE 37   Recent Labs  Lab 05/05/19 1328  AMMONIA 59*   Coagulation Profile: Recent Labs  Lab 05/05/19 1327  INR 1.3*   Urine analysis:    Component Value Date/Time   COLORURINE AMBER (A) 05/05/2019 1650   APPEARANCEUR CLEAR 05/05/2019 1650   LABSPEC >1.046 (H) 05/05/2019 1650   PHURINE 6.0 05/05/2019 1650   GLUCOSEU NEGATIVE 05/05/2019 1650   HGBUR NEGATIVE 05/05/2019 1650   BILIRUBINUR SMALL (A) 05/05/2019 1650   KETONESUR NEGATIVE 05/05/2019 1650   PROTEINUR NEGATIVE 05/05/2019 1650   NITRITE NEGATIVE 05/05/2019 1650   LEUKOCYTESUR NEGATIVE 05/05/2019 1650    Recent Results (from the  past 240 hour(s))  SARS CORONAVIRUS 2 (TAT 6-24 HRS) Nasopharyngeal Nasopharyngeal Swab     Status: None   Collection Time: 05/05/19  3:38 PM   Specimen: Nasopharyngeal Swab  Result Value Ref Range Status   SARS Coronavirus 2 NEGATIVE NEGATIVE Final    Comment: (NOTE) SARS-CoV-2 target nucleic acids are NOT DETECTED. The SARS-CoV-2 RNA is generally detectable in upper and lower respiratory specimens during the acute phase of infection. Negative results do not preclude SARS-CoV-2 infection, do not rule out co-infections with other pathogens, and should not be used as the sole basis for treatment or other patient management decisions. Negative results must be combined with clinical observations, patient history, and epidemiological information. The expected result is Negative. Fact Sheet for Patients: HairSlick.nohttps://www.fda.gov/media/138098/download Fact Sheet for Healthcare Providers: quierodirigir.comhttps://www.fda.gov/media/138095/download This test is not yet approved or cleared by the [place] FDA and  has been authorized for detection and/or diagnosis of SARS-CoV-2 by FDA under an Emergency Use Authorization (EUA). This EUA will remain  in effect (meaning this test can be used) for the duration of the COVID-19 declaration under Section 56 4(b)(1) of the Act, 21 U.S.C. section 360bbb-3(b)(1), unless the authorization is terminated or revoked sooner. Performed at [facility], [address] [place], [place] [address]   Blood Culture (routine x 2)     Status: None (Preliminary result)   Collection Time: 05/05/19  4:33 PM   Specimen: BLOOD  Result Value Ref Range Status   Specimen Description BLOOD  Final   Special Requests NONE  Final   Culture   Final    NO GROWTH < 24 HOURS Performed at [facility], [address] [place], [place] [address]    Report Status PENDING  Incomplete  Blood Culture (routine x 2)     Status: None (Preliminary result)   Collection Time: 05/05/19  4:39 PM     Specimen: BLOOD  Result Value Ref Range Status   Specimen Description BLOOD  Final   Special Requests NONE  Final   Culture   Final    NO GROWTH < 24 HOURS Performed at [facility], [address] [place], [place] [address]    Report Status PENDING  Incomplete     Radiology Studies: Dg Chest 2 View  Result Date: 05/05/2019 CLINICAL DATA:  Palpitations EXAM: CHEST - 2 VIEW COMPARISON:  None. FINDINGS: The heart size and mediastinal contours are within normal limits. No focal consolidation, pleural effusion, or pneumothorax. IMPRESSION: No active cardiopulmonary disease. Electronically Signed   By: Duanne GuessNicholas  Plundo M.D.   On: 05/05/2019 15:36   Ct Abdomen Pelvis W Contrast  Result Date: 05/05/2019 CLINICAL DATA:  Acute abdominal pain for 2  weeks EXAM: CT ABDOMEN AND PELVIS WITH CONTRAST TECHNIQUE: Multidetector CT imaging of the abdomen and pelvis was performed using the standard protocol following bolus administration of intravenous contrast. CONTRAST:  OMNIPAQUE IOHEXOL 300 MG/ML  SOLN COMPARISON:  None. FINDINGS: Lower chest: The visualized heart size within normal limits. No pericardial fluid/thickening. No hiatal hernia. Small amount of streaky atelectasis at both lung bases. Hepatobiliary: There is diffuse low density seen throughout the liver parenchyma. The liver appears to be enlarged measuring 20 cm in craniocaudal dimension. No focal hepatic lesion however is seen.The main portal vein is patent. No evidence of calcified gallstones, gallbladder wall thickening or biliary dilatation. Pancreas: Unremarkable. No pancreatic ductal dilatation or surrounding inflammatory changes. Spleen: Normal in size without focal abnormality. Adrenals/Urinary Tract: Both adrenal glands appear normal. The kidneys and collecting system appear normal without evidence of urinary tract calculus or hydronephrosis. Bladder is unremarkable. Stomach/Bowel: The stomach and small bowel are normal in appearance.  There is diffuse bowel wall edema noted within the hepatic flexure and cecum. The sigmoid colon appears to be decompressed and narrowed. Vascular/Lymphatic: There is a moderate amount of perihepatic and pericolonic ascites extending into the deep pelvis. Scattered aortic atherosclerotic calcifications are seen without aneurysmal dilatation. Reproductive: The prostate is unremarkable. Other: No evidence of abdominal wall mass or hernia. Musculoskeletal: No acute or significant osseous findings. IMPRESSION: 1. Hepatic steatosis and hepatomegaly 2. Moderate abdominopelvic ascites 3. Edematous appearance to the hepatic flexure and cecum. Findings may be infectious, inflammatory, or secondary to hypoalbuminemia. Electronically Signed   By: Jonna Clark M.D.   On: 05/05/2019 15:20   US Abdomen Limited Ruq  Result Date: 05/05/2019 CLINICAL DATA:  Right upper quadrant pain EXAM: ULTRASOUND ABDOMEN LIMITED RIGHT UPPER QUADRANT COMPARISON:  Same-day CT FINDINGS: Gallbladder: No gallstones or wall thickening visualized. No sonographic Murphy sign noted by sonographer. Common bile duct: Diameter: 3 mm Liver: Liver is enlarged and diffusely echogenic. Evaluation of the liver is significantly limited secondary to poor penetration. Within these limitations, no discrete hepatic lesion was identified. Doppler evaluation of the portal vein was unable to be performed. Other: Small volume ascites within the upper abdomen. IMPRESSION: 1. Hepatomegaly and hepatic steatosis. Evaluation of the liver was limited secondary to poor penetration. Blood flow within the portal vein was unable to be evaluated. CT of the abdomen and pelvis performed on the same day revealed a patent portal vein. 2. Small volume ascites within the upper abdomen. Electronically Signed   By: Duanne Guess M.D.   On: 05/05/2019 15:58    Scheduled Meds:  diltiazem  90 mg Oral Q6H   folic acid  1 mg Oral Daily   lactulose  20 g Oral BID   LORazepam  0-4  mg Intravenous Q4H   Followed by   Melene Muller ON 05/07/2019] LORazepam  0-4 mg Intravenous Q8H   multivitamin with minerals  1 tablet Oral Daily   nicotine  21 mg Transdermal Daily   thiamine  100 mg Oral Daily   Or   thiamine  100 mg Intravenous Daily   Continuous Infusions:  ceFEPime (MAXIPIME) IV Stopped (05/06/19 0816)   heparin 1,300 Units/hr (05/05/19 2335)   vancomycin 750 mg (05/06/19 0856)     LOS: 1 day    Time spent: 35 minutes. Greater than 50% of this time was spent in direct contact with the patient, coordinating care and discussing relevant ongoing clinical issues, including alcoholic cirrhosis, ascites and new presentation of atrial flutter.  Patient denies chest  pain, shortness of breath and express abdominal discomfort and tightness.  No nausea, no vomiting tolerating diet.  After 16 hours of Cardizem drip and 1 dose of amiodarone his rate is controlled and has now converted into sinus rhythm.  Both working to transition him into oral Cardizem and stop Cardizem drip.  Still feel is appropriate progressive unit in case that we ended requiring Cardizem drip again.  Patient will need to be transferred to [facility] for further evaluation and management by cardiology service and interventional radiologist providing large volume paracentesis.  At this particular moment will narrow antibiotics and transition to Levaquin encouraged to continue providing protection empirically for SBP and intra-abdominal infection.    Vassie Lollarlos Ximena Todaro, MD [facility] Pager [phone]   05/06/2019, 10:38 AM

## 2019-05-06 NOTE — ED Notes (Signed)
Pt is alert and oriented. NAD. O2 sats 95% on RA.

## 2019-05-06 NOTE — Progress Notes (Signed)
ANTICOAGULATION CONSULT NOTE  Pharmacy Consult:  Heparin Indication: atrial fibrillation  No Known Allergies  Patient Measurements: Height: 5\' 8"  (172.7 cm) Weight: 175 lb (79.4 kg) IBW/kg (Calculated) : 68.4 HEPARIN DW (KG): 79.4  Vital Signs: Temp: 99.1 F (37.3 C) (09/19 1446) Temp Source: Oral (09/19 1446) BP: 141/101 (09/19 1557) Pulse Rate: 101 (09/19 1459)  Labs: Recent Labs    05/05/19 1327 05/05/19 2240 05/06/19 0532 05/06/19 0533 05/06/19 1504  HGB 13.3  --  11.4*  --   --   HCT 37.3*  --  32.8*  --   --   PLT 223  --  213  --   --   LABPROT 16.2*  --   --   --   --   INR 1.3*  --   --   --   --   HEPARINUNFRC  --  0.16*  --  0.38 0.16*  CREATININE 0.56*  --  0.61  --   --     Estimated Creatinine Clearance: 97.4 mL/min (by C-G formula based on SCr of 0.61 mg/dL).  Assessment: 61 YOM presented to [facility] with abdominal pain.  Patient was started on IV heparin for new-onset Afib and this AM's level at [facility] was therapeutic.  Confirmatory heparin level upon arrival to [facility] was sub-therapeutic at 0.16 units/mL.  RN reported that since patient arrive to [facility], there has been no issue with the infusion.  Patient then went for a paracentesis so heparin rate wasn't adjusted immediately.  RN is unsure whether heparin was turned off while he was off the floor, but heparin infusion was on when he returned to the unit.  No bleeding reported.  Goal of Therapy:  Heparin level 0.3-0.7 units/ml Monitor platelets by anticoagulation protocol: Yes   Plan:  Increase heparin gtt to 1500 units/hr Check 6 hr heparin level Monitor for bleeding  Diaz Crago D. Mina Marble, PharmD, BCPS, [place] 05/06/2019, 7:09 PM

## 2019-05-06 NOTE — ED Notes (Signed)
Given Breakfast Tray

## 2019-05-06 NOTE — ED Notes (Signed)
Pt converted to NSR. Have notified Dr. Dyann Kief and awaiting orders to discontinue Cardizem

## 2019-05-06 NOTE — Progress Notes (Signed)
Patient arrived from [facility].  Evaluated patient at the bedside.  Discussed with attending physician who had seen patient today. Patient with minimal abdominal pain.  Otherwise remains a stable.  Underwent paracentesis, 1.5 L clear yellow fluid removed.  Remains sinus rhythm and currently on oral Cardizem.
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
## 2019-05-06 NOTE — ED Notes (Signed)
[facility] here to pick up pt for transport

## 2019-05-07 ENCOUNTER — Inpatient Hospital Stay (HOSPITAL_COMMUNITY): Payer: Medicaid Other

## 2019-05-07 ENCOUNTER — Encounter (HOSPITAL_COMMUNITY): Payer: Self-pay | Admitting: Cardiology

## 2019-05-07 DIAGNOSIS — I484 Atypical atrial flutter: Secondary | ICD-10-CM

## 2019-05-07 DIAGNOSIS — I4892 Unspecified atrial flutter: Secondary | ICD-10-CM

## 2019-05-07 LAB — ECHOCARDIOGRAM COMPLETE
Height: 68 in
Weight: 3067.2 oz

## 2019-05-07 LAB — COMPREHENSIVE METABOLIC PANEL
ALT: 55 U/L — ABNORMAL HIGH (ref 0–44)
AST: 129 U/L — ABNORMAL HIGH (ref 15–41)
Albumin: 2 g/dL — ABNORMAL LOW (ref 3.5–5.0)
Alkaline Phosphatase: 291 U/L — ABNORMAL HIGH (ref 38–126)
Anion gap: 12 (ref 5–15)
BUN: 8 mg/dL (ref 6–20)
CO2: 21 mmol/L — ABNORMAL LOW (ref 22–32)
Calcium: 8.1 mg/dL — ABNORMAL LOW (ref 8.9–10.3)
Chloride: 96 mmol/L — ABNORMAL LOW (ref 98–111)
Creatinine, Ser: 0.76 mg/dL (ref 0.61–1.24)
GFR calc Af Amer: >60 mL/min (ref >60)
GFR calc non Af Amer: >60 mL/min (ref >60)
Glucose, Bld: 110 mg/dL — ABNORMAL HIGH (ref 70–99)
Potassium: 3.9 mmol/L (ref 3.5–5.1)
Sodium: 129 mmol/L — ABNORMAL LOW (ref 135–145)
Total Bilirubin: 6.5 mg/dL — ABNORMAL HIGH (ref 0.3–1.2)
Total Protein: 5.9 g/dL — ABNORMAL LOW (ref 6.5–8.1)

## 2019-05-07 LAB — GRAM STAIN: Gram Stain: NONE SEEN

## 2019-05-07 LAB — CBC
HCT: 30.7 % — ABNORMAL LOW (ref 39.0–52.0)
Hemoglobin: 11.1 g/dL — ABNORMAL LOW (ref 13.0–17.0)
MCH: 40.1 pg — ABNORMAL HIGH (ref 26.0–34.0)
MCHC: 36.2 g/dL — ABNORMAL HIGH (ref 30.0–36.0)
MCV: 110.8 fL — ABNORMAL HIGH (ref 80.0–100.0)
Platelets: 185 10*3/uL (ref 150–400)
RBC: 2.77 MIL/uL — ABNORMAL LOW (ref 4.22–5.81)
RDW: 18.1 % — ABNORMAL HIGH (ref 11.5–15.5)
WBC: 13 10*3/uL — ABNORMAL HIGH (ref 4.0–10.5)
nRBC: 0.3 % — ABNORMAL HIGH (ref 0.0–0.2)

## 2019-05-07 LAB — PROTIME-INR
INR: 1.9 — ABNORMAL HIGH (ref 0.8–1.2)
Prothrombin Time: 21.3 seconds — ABNORMAL HIGH (ref 11.4–15.2)

## 2019-05-07 LAB — HEPARIN LEVEL (UNFRACTIONATED): Heparin Unfractionated: 0.17 IU/mL — ABNORMAL LOW (ref 0.30–0.70)

## 2019-05-07 LAB — MAGNESIUM: Magnesium: 1.4 mg/dL — ABNORMAL LOW (ref 1.7–2.4)

## 2019-05-07 LAB — HIV ANTIBODY (ROUTINE TESTING W REFLEX): HIV Screen 4th Generation wRfx: NONREACTIVE

## 2019-05-07 MED ORDER — SPIRONOLACTONE 25 MG PO TABS
25.0000 mg | ORAL_TABLET | Freq: Every day | ORAL | Status: DC
  Administered 2019-05-07 – 2019-05-08 (×2): 25 mg via ORAL
  Filled 2019-05-07 (×2): qty 1

## 2019-05-07 MED ORDER — PNEUMOCOCCAL VAC POLYVALENT 25 MCG/0.5ML IJ INJ
0.5000 mL | INJECTION | INTRAMUSCULAR | Status: AC
  Administered 2019-05-08: 0.5 mL via INTRAMUSCULAR
  Filled 2019-05-07: qty 0.5

## 2019-05-07 MED ORDER — FUROSEMIDE 20 MG PO TABS
20.0000 mg | ORAL_TABLET | Freq: Every day | ORAL | Status: DC
  Administered 2019-05-07 – 2019-05-08 (×2): 20 mg via ORAL
  Filled 2019-05-07 (×2): qty 1

## 2019-05-07 MED ORDER — DILTIAZEM HCL ER COATED BEADS 180 MG PO CP24
360.0000 mg | ORAL_CAPSULE | Freq: Every day | ORAL | Status: DC
  Administered 2019-05-08 – 2019-05-09 (×2): 360 mg via ORAL
  Filled 2019-05-07 (×2): qty 2

## 2019-05-07 MED ORDER — WHITE PETROLATUM EX OINT
TOPICAL_OINTMENT | CUTANEOUS | Status: AC
  Administered 2019-05-07: 14:00:00
  Filled 2019-05-07: qty 28.35

## 2019-05-07 MED ORDER — ASPIRIN EC 81 MG PO TBEC
81.0000 mg | DELAYED_RELEASE_TABLET | Freq: Every day | ORAL | Status: DC
  Administered 2019-05-07 – 2019-05-09 (×3): 81 mg via ORAL
  Filled 2019-05-07 (×3): qty 1

## 2019-05-07 MED ORDER — INFLUENZA VAC SPLIT QUAD 0.5 ML IM SUSY
0.5000 mL | PREFILLED_SYRINGE | INTRAMUSCULAR | Status: AC
  Administered 2019-05-08: 0.5 mL via INTRAMUSCULAR
  Filled 2019-05-07: qty 0.5

## 2019-05-07 NOTE — Progress Notes (Signed)
  Echocardiogram 2D Echocardiogram has been performed.  Henry [name] 05/07/2019, 10:35 AM
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
## 2019-05-07 NOTE — Progress Notes (Signed)
ANTICOAGULATION CONSULT NOTE  Pharmacy Consult:  Heparin Indication: atrial fibrillation  No Known Allergies  Patient Measurements: Height: 5\' 8"  (172.7 cm) Weight: 175 lb (79.4 kg) IBW/kg (Calculated) : 68.4 HEPARIN DW (KG): 79.4  Vital Signs: Temp: 98.4 F (36.9 C) (09/20 0027) Temp Source: Oral (09/20 0027) BP: 115/92 (09/20 0027) Pulse Rate: 97 (09/20 0027)  Labs: Recent Labs    05/05/19 1327  05/06/19 0532 05/06/19 0533 05/06/19 1504 05/07/19 0202  HGB 13.3  --  11.4*  --   --  11.1*  HCT 37.3*  --  32.8*  --   --  30.7*  PLT 223  --  213  --   --  185  LABPROT 16.2*  --   --   --   --   --   INR 1.3*  --   --   --   --   --   HEPARINUNFRC  --    < >  --  0.38 0.16* 0.17*  CREATININE 0.56*  --  0.61  --   --  0.76   < > = values in this interval not displayed.    Estimated Creatinine Clearance: 97.4 mL/min (by C-G formula based on SCr of 0.76 mg/dL).  Assessment: 54 YOM presented to [facility] with abdominal pain.  Patient was started on IV heparin for new-onset Afib.  Heparin level subtherapeutic (0.17) on gtt at 1500 units/hr. No issues with line or bleeding reported per RN.  Also noted INR 1.9 (not on coumadin PTA), likely liver dysfunction causing this  Goal of Therapy:  Heparin level 0.3-0.7 units/ml Monitor platelets by anticoagulation protocol: Yes   Plan:  Increase heparin gtt to 1700 units/hr Check 6 hr heparin level Monitor for bleeding  Sherlon Handing, PharmD, BCPS 05/07/2019, 3:36 AM
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
## 2019-05-07 NOTE — Progress Notes (Signed)
PROGRESS NOTE    Henry [name]  ZOX:[id number] DOB: [birth year] DOA: 05/05/2019 PCP: Patient, No Pcp Per    Brief Narrative:  Patient is 58 year old male with history of hypertension, traumatic left lower extremity amputation, heavy alcohol abuse who presented to the emergency room secondary to generalized weakness, abdominal distention and right leg swelling for more than 3 weeks, also with exertional dyspnea.  Patient does drink 1 paint of whiskey per day.  In the emergency room he was found to be in a flutter, has significant ascites, patient was found with lactic acid of 4, mildly elevated AST ALT, total bilirubin of 4.8.  CT abdomen pelvis significant for hepatomegaly/steatosis with ascites.   Assessment & Plan:   Active Problems:   Alcoholic liver failure (HCC)   Atrial flutter (HCC)   Alcoholic cirrhosis of liver with ascites (HCC)   Tobacco abuse  Paroxysmal a flutter with RVR: Converted to normal sinus rhythm.  Patient initially received amiodarone and Cardizem infusion.  Currently on short acting Cardizem and changing to long-acting Cardizem.  Was started on heparin drip.  Will discontinue.  Start on aspirin 81 mg daily.  Now rate controlled in sinus rhythm.  Alcoholic liver disease/decompensated ascites: Hepatitis panel negative.  Consistent with alcoholic liver disease.  Discriminant function score 34. Supportive treatment. Lactulose as needed. Started on Lasix and Aldactone today.  Followed by gastroenterology. Initial paracentesis yielded 1.5 L transudate.  No growth.  No evidence of infection. Patient still has tense abdomen, discussed with interventional radiology for repeat paracentesis.  Scheduled for tomorrow.  Smoker: Counseled to quit.  On nicotine patch.  Alcoholism with alcohol-related disorder/risk of alcohol withdrawal: Remains on multivitamins.  Remains on benzodiazepine based CIWA protocol.  High risk of withdrawal.  Patient was extensively counseled.  We will  continue to monitor.   DVT prophylaxis: SCDs Code Status: Full code Family Communication: None Disposition Plan: Home.  Anticipate next 24 to 48 hours with outpatient follow-up.   Consultants:   Cardiology  Gastroenterology  Procedures:   Paracentesis, transudate 1.5 L on 05/06/2019  Antimicrobials:   Levaquin, 05/05/2019--- stopped   Subjective: Patient seen and examined.  Still has some bloating sensation on his abdomen.  Patient complains of occasional hemorrhoidal bleeding and mucoid material on the stool.  Otherwise no overnight events.  Remains sinus rhythm. Patient does not drive.  He wants to follow-up somebody at [place].  Objective: Vitals:   05/07/19 0640 05/07/19 0759 05/07/19 0804 05/07/19 1143  BP:  (!) 151/90 (!) 151/90 (!) 138/107  Pulse:  94  (!) 102  Resp:  (!) 22  16  Temp:  97.8 F (36.6 C)    TempSrc:  Oral    SpO2:  93%  97%  Weight: 87 kg     Height:        Intake/Output Summary (Last 24 hours) at 05/07/2019 1353 Last data filed at 05/07/2019 1030 Gross per 24 hour  Intake 1574.72 ml  Output 400 ml  Net 1174.72 ml   Filed Weights   05/05/19 1305 05/07/19 0640  Weight: 79.4 kg 87 kg    Examination:  General exam: Appears calm and comfortable, chronically sick looking.  On room air. Respiratory system: Clear to auscultation. Respiratory effort normal. Cardiovascular system: S1 & S2 heard, RRR. No JVD, murmurs, rubs, gallops or clicks.  2+ edema right leg. Gastrointestinal system: Abdomen is distended, soft and nontender. No organomegaly or masses felt. Normal bowel sounds heard. Central nervous system: Alert and oriented. No focal  neurological deficits. Extremities: Symmetric 5 x 5 power.  Left below-knee amputation stump clean and dry. Skin: No rashes, lesions or ulcers Psychiatry: Judgement and insight appear normal. Mood & affect appropriate.     Data Reviewed: I have personally reviewed following labs and imaging studies   CBC: Recent Labs  Lab 05/05/19 1327 05/06/19 0532 05/07/19 0202  WBC 12.3* 14.3* 13.0*  NEUTROABS 9.2*  --   --   HGB 13.3 11.4* 11.1*  HCT 37.3* 32.8* 30.7*  MCV 111.3* 110.8* 110.8*  PLT 223 213 185   Basic Metabolic Panel: Recent Labs  Lab 05/05/19 1327 05/06/19 0532 05/07/19 0202  NA 133* 132* 129*  K 3.7 4.0 3.9  CL 98 99 96*  CO2 22 22 21*  GLUCOSE 118* 126* 110*  BUN 6 7 8   CREATININE 0.56* 0.61 0.76  CALCIUM 8.0* 7.8* 8.1*  MG  --   --  1.4*   GFR: Estimated Creatinine Clearance: 107.9 mL/min (by C-G formula based on SCr of 0.76 mg/dL). Liver Function Tests: Recent Labs  Lab 05/05/19 1327 05/06/19 0532 05/07/19 0202  AST 219* 183* 129*  ALT 76* 64* 55*  ALKPHOS 407* 347* 291*  BILITOT 4.8* 6.6* 6.5*  PROT 7.3 6.6 5.9*  ALBUMIN 2.7* 2.5* 2.0*   Recent Labs  Lab 05/05/19 1327  LIPASE 37   Recent Labs  Lab 05/05/19 1328  AMMONIA 59*   Coagulation Profile: Recent Labs  Lab 05/05/19 1327 05/07/19 0202  INR 1.3* 1.9*   Cardiac Enzymes: No results for input(s): CKTOTAL, CKMB, CKMBINDEX, TROPONINI in the last 168 hours. BNP (last 3 results) No results for input(s): PROBNP in the last 8760 hours. HbA1C: No results for input(s): HGBA1C in the last 72 hours. CBG: Recent Labs  Lab 05/06/19 2120  GLUCAP 119*   Lipid Profile: No results for input(s): CHOL, HDL, LDLCALC, TRIG, CHOLHDL, LDLDIRECT in the last 72 hours. Thyroid Function Tests: No results for input(s): TSH, T4TOTAL, FREET4, T3FREE, THYROIDAB in the last 72 hours. Anemia Panel: No results for input(s): VITAMINB12, FOLATE, FERRITIN, TIBC, IRON, RETICCTPCT in the last 72 hours. Sepsis Labs: Recent Labs  Lab 05/05/19 1328 05/05/19 1639  LATICACIDVEN 4.0* 3.9*    Recent Results (from the past 240 hour(s))  SARS CORONAVIRUS 2 (TAT 6-24 HRS) Nasopharyngeal Nasopharyngeal Swab     Status: None   Collection Time: 05/05/19  3:38 PM   Specimen: Nasopharyngeal Swab  Result Value  Ref Range Status   SARS Coronavirus 2 NEGATIVE NEGATIVE Final    Comment: (NOTE) SARS-CoV-2 target nucleic acids are NOT DETECTED. The SARS-CoV-2 RNA is generally detectable in upper and lower respiratory specimens during the acute phase of infection. Negative results do not preclude SARS-CoV-2 infection, do not rule out co-infections with other pathogens, and should not be used as the sole basis for treatment or other patient management decisions. Negative results must be combined with clinical observations, patient history, and epidemiological information. The expected result is Negative. Fact Sheet for Patients: HairSlick.no Fact Sheet for Healthcare Providers: quierodirigir.com This test is not yet approved or cleared by the [place] FDA and  has been authorized for detection and/or diagnosis of SARS-CoV-2 by FDA under an Emergency Use Authorization (EUA). This EUA will remain  in effect (meaning this test can be used) for the duration of the COVID-19 declaration under Section 56 4(b)(1) of the Act, 21 U.S.C. section 360bbb-3(b)(1), unless the authorization is terminated or revoked sooner. Performed at [facility], [address],  [place], [place] [address]   Blood Culture (routine x 2)     Status: None (Preliminary result)   Collection Time: 05/05/19  4:33 PM   Specimen: BLOOD  Result Value Ref Range Status   Specimen Description BLOOD  Final   Special Requests NONE  Final   Culture   Final    NO GROWTH 2 DAYS Performed at [facility], [address] [place], [place] [address]    Report Status PENDING  Incomplete  Blood Culture (routine x 2)     Status: None (Preliminary result)   Collection Time: 05/05/19  4:39 PM   Specimen: BLOOD  Result Value Ref Range Status   Specimen Description BLOOD  Final   Special Requests NONE  Final   Culture   Final    NO GROWTH 2 DAYS Performed at [facility],  [address] [place], [place] [address]    Report Status PENDING  Incomplete  Gram stain     Status: None   Collection Time: 05/06/19  4:07 PM   Specimen: Peritoneal Washings  Result Value Ref Range Status   Specimen Description PERITONEAL  Final   Special Requests NONE  Final   Gram Stain   Final    NO WBC SEEN NO ORGANISMS SEEN Performed at [facility], [address] [place], [place] [address]    Report Status 05/07/2019 FINAL  Final         Radiology Studies: Dg Chest 2 View  Result Date: 05/05/2019 CLINICAL DATA:  Palpitations EXAM: CHEST - 2 VIEW COMPARISON:  None. FINDINGS: The heart size and mediastinal contours are within normal limits. No focal consolidation, pleural effusion, or pneumothorax. IMPRESSION: No active cardiopulmonary disease. Electronically Signed   By: Duanne GuessNicholas  Plundo M.D.   On: 05/05/2019 15:36   Ct Abdomen Pelvis W Contrast  Result Date: 05/05/2019 CLINICAL DATA:  Acute abdominal pain for 2 weeks EXAM: CT ABDOMEN AND PELVIS WITH CONTRAST TECHNIQUE: Multidetector CT imaging of the abdomen and pelvis was performed using the standard protocol following bolus administration of intravenous contrast. CONTRAST:  100mL OMNIPAQUE IOHEXOL 300 MG/ML  SOLN COMPARISON:  None. FINDINGS: Lower chest: The visualized heart size within normal limits. No pericardial fluid/thickening. No hiatal hernia. Small amount of streaky atelectasis at both lung bases. Hepatobiliary: There is diffuse low density seen throughout the liver parenchyma. The liver appears to be enlarged measuring 20 cm in craniocaudal dimension. No focal hepatic lesion however is seen.The main portal vein is patent. No evidence of calcified gallstones, gallbladder wall thickening or biliary dilatation. Pancreas: Unremarkable. No pancreatic ductal dilatation or surrounding inflammatory changes. Spleen: Normal in size without focal abnormality. Adrenals/Urinary Tract: Both adrenal glands appear normal. The  kidneys and collecting system appear normal without evidence of urinary tract calculus or hydronephrosis. Bladder is unremarkable. Stomach/Bowel: The stomach and small bowel are normal in appearance. There is diffuse bowel wall edema noted within the hepatic flexure and cecum. The sigmoid colon appears to be decompressed and narrowed. Vascular/Lymphatic: There is a moderate amount of perihepatic and pericolonic ascites extending into the deep pelvis. Scattered aortic atherosclerotic calcifications are seen without aneurysmal dilatation. Reproductive: The prostate is unremarkable. Other: No evidence of abdominal wall mass or hernia. Musculoskeletal: No acute or significant osseous findings. IMPRESSION: 1. Hepatic steatosis and hepatomegaly 2. Moderate abdominopelvic ascites 3. Edematous appearance to the hepatic flexure and cecum. Findings may be infectious, inflammatory, or secondary to hypoalbuminemia. Electronically Signed   By: Jonna ClarkBindu  Avutu M.D.   On:  05/05/2019 15:20   US Paracentesis  Result Date: 05/07/2019 INDICATION: Patient with history of heavy alcohol abuse, cirrhosis with new onset abdominal distension. Request IR for diagnostic and therapeutic paracentesis. EXAM: ULTRASOUND GUIDED DIAGNOSTIC AND THERAPEUTIC PARACENTESIS MEDICATIONS: 8 mL 1% lidocaine COMPLICATIONS: None immediate. PROCEDURE: Informed written consent was obtained from the patient after a discussion of the risks, benefits and alternatives to treatment. A timeout was performed prior to the initiation of the procedure. Initial ultrasound scanning demonstrates a large amount of ascites within the left lower abdominal quadrant. The left lower abdomen was prepped and draped in the usual sterile fashion. 1% lidocaine was used for local anesthesia. Following this, a 19 gauge, 7-cm, Yueh catheter was introduced. An ultrasound image was saved for documentation purposes. The paracentesis was performed. The catheter was removed and a dressing  was applied. The patient tolerated the procedure well without immediate post procedural complication. FINDINGS: A total of approximately 1.5 L of clear yellow fluid was removed. Samples were sent to the laboratory as requested by the clinical team. IMPRESSION: Successful ultrasound-guided paracentesis yielding 1.5 liters of peritoneal fluid. Read by Candiss Norse, PA-C Electronically Signed   By: Lucrezia Europe M.D.   On: 05/06/2019 16:02   US Abdomen Limited Ruq  Result Date: 05/05/2019 CLINICAL DATA:  Right upper quadrant pain EXAM: ULTRASOUND ABDOMEN LIMITED RIGHT UPPER QUADRANT COMPARISON:  Same-day CT FINDINGS: Gallbladder: No gallstones or wall thickening visualized. No sonographic Murphy sign noted by sonographer. Common bile duct: Diameter: 3 mm Liver: Liver is enlarged and diffusely echogenic. Evaluation of the liver is significantly limited secondary to poor penetration. Within these limitations, no discrete hepatic lesion was identified. Doppler evaluation of the portal vein was unable to be performed. Other: Small volume ascites within the upper abdomen. IMPRESSION: 1. Hepatomegaly and hepatic steatosis. Evaluation of the liver was limited secondary to poor penetration. Blood flow within the portal vein was unable to be evaluated. CT of the abdomen and pelvis performed on the same day revealed a patent portal vein. 2. Small volume ascites within the upper abdomen. Electronically Signed   By: Davina Poke M.D.   On: 05/05/2019 15:58        Scheduled Meds: . white petrolatum      . aspirin EC  81 mg Oral Daily  . [START ON 05/08/2019] diltiazem  360 mg Oral Daily  . diltiazem  90 mg Oral X3K  . folic acid  1 mg Oral Daily  . furosemide  20 mg Oral Daily  . lactulose  20 g Oral BID  . LORazepam  0-4 mg Intravenous Q4H   Followed by  . LORazepam  0-4 mg Intravenous Q8H  . multivitamin with minerals  1 tablet Oral Daily  . nicotine  21 mg Transdermal Daily  . spironolactone  25 mg  Oral Daily  . thiamine  100 mg Oral Daily   Continuous Infusions:   LOS: 2 days    Time spent: 35 minutes    Barb Merino, MD [facility] Pager [phone]  If 7PM-7AM, please contact night-coverage [email or web address] Password [email or web address] 05/07/2019, 1:53 PM

## 2019-05-08 ENCOUNTER — Encounter (HOSPITAL_COMMUNITY): Payer: Self-pay | Admitting: Student

## 2019-05-08 ENCOUNTER — Inpatient Hospital Stay (HOSPITAL_COMMUNITY): Payer: Medicaid Other

## 2019-05-08 DIAGNOSIS — K7040 Alcoholic hepatic failure without coma: Principal | ICD-10-CM

## 2019-05-08 HISTORY — PX: IR PARACENTESIS: IMG2679

## 2019-05-08 LAB — COMPREHENSIVE METABOLIC PANEL
ALT: 43 U/L (ref 0–44)
AST: 93 U/L — ABNORMAL HIGH (ref 15–41)
Albumin: 1.9 g/dL — ABNORMAL LOW (ref 3.5–5.0)
Alkaline Phosphatase: 263 U/L — ABNORMAL HIGH (ref 38–126)
Anion gap: 9 (ref 5–15)
BUN: 7 mg/dL (ref 6–20)
CO2: 23 mmol/L (ref 22–32)
Calcium: 8 mg/dL — ABNORMAL LOW (ref 8.9–10.3)
Chloride: 98 mmol/L (ref 98–111)
Creatinine, Ser: 0.69 mg/dL (ref 0.61–1.24)
GFR calc Af Amer: >60 mL/min (ref >60)
GFR calc non Af Amer: >60 mL/min (ref >60)
Glucose, Bld: 98 mg/dL (ref 70–99)
Potassium: 3.5 mmol/L (ref 3.5–5.1)
Sodium: 130 mmol/L — ABNORMAL LOW (ref 135–145)
Total Bilirubin: 6.2 mg/dL — ABNORMAL HIGH (ref 0.3–1.2)
Total Protein: 5.5 g/dL — ABNORMAL LOW (ref 6.5–8.1)

## 2019-05-08 LAB — PROTIME-INR
INR: 1.7 — ABNORMAL HIGH (ref 0.8–1.2)
Prothrombin Time: 19.8 seconds — ABNORMAL HIGH (ref 11.4–15.2)

## 2019-05-08 LAB — PH, BODY FLUID: pH, Body Fluid: 7.7

## 2019-05-08 MED ORDER — SPIRONOLACTONE 25 MG PO TABS
50.0000 mg | ORAL_TABLET | Freq: Every day | ORAL | Status: DC
  Administered 2019-05-09: 09:00:00 50 mg via ORAL
  Filled 2019-05-08: qty 2

## 2019-05-08 MED ORDER — FUROSEMIDE 40 MG PO TABS
40.0000 mg | ORAL_TABLET | Freq: Every day | ORAL | Status: DC
  Administered 2019-05-09: 09:00:00 40 mg via ORAL
  Filled 2019-05-08: qty 1

## 2019-05-08 MED ORDER — LIDOCAINE HCL 1 % IJ SOLN
INTRAMUSCULAR | Status: DC | PRN
  Administered 2019-05-08: 10 mL

## 2019-05-08 MED ORDER — ALBUMIN HUMAN 25 % IV SOLN
50.0000 g | Freq: Once | INTRAVENOUS | Status: AC
  Administered 2019-05-08: 50 g via INTRAVENOUS
  Filled 2019-05-08: qty 50

## 2019-05-08 MED ORDER — LIDOCAINE HCL 1 % IJ SOLN
INTRAMUSCULAR | Status: AC
  Filled 2019-05-08: qty 20

## 2019-05-08 NOTE — Progress Notes (Signed)
[facility] Gastroenterology Progress Note  Henry [name] 58 y.o. [birth year]  CC: Alcoholic hepatitis, ascites   Subjective: Continues to have abdominal distention.  Complaining of nausea from abdominal distention.  Denies diarrhea or constipation.  Denies any blood in the stool.  ROS : Negative for acute chest pain and shortness of breath.   Objective: Vital signs in last 24 hours: Vitals:   05/08/19 0458 05/08/19 0745  BP: 108/82 131/89  Pulse: 97 96  Resp: 20 19  Temp: 98.4 F (36.9 C) 98.2 F (36.8 C)  SpO2: 96% 98%    Physical Exam:  General:  Alert, cooperative, no distress, appears stated age  Head:  Normocephalic, without obvious abnormality, atraumatic  Eyes:   Mild scleral icterus noted  Lungs:   Clear to auscultation bilaterally, respirations unlabored, anterior exam only  Heart:  Regular rate and rhythm, S1, S2 normal  Abdomen:    Abdomen is significantly distended, bowel sounds present, no peritoneal signs  Extremities:  Right lower extremity edema noted.  Left BKA with prosthesis noted.       Lab Results: Recent Labs    05/07/19 0202 05/08/19 0514  NA 129* 130*  K 3.9 3.5  CL 96* 98  CO2 21* 23  GLUCOSE 110* 98  BUN 8 7  CREATININE 0.76 0.69  CALCIUM 8.1* 8.0*  MG 1.4*  --    Recent Labs    05/07/19 0202 05/08/19 0514  AST 129* 93*  ALT 55* 43  ALKPHOS 291* 263*  BILITOT 6.5* 6.2*  PROT 5.9* 5.5*  ALBUMIN 2.0* 1.9*   Recent Labs    05/05/19 1327 05/06/19 0532 05/07/19 0202  WBC 12.3* 14.3* 13.0*  NEUTROABS 9.2*  --   --   HGB 13.3 11.4* 11.1*  HCT 37.3* 32.8* 30.7*  MCV 111.3* 110.8* 110.8*  PLT 223 213 185   Recent Labs    05/07/19 0202 05/08/19 0514  LABPROT 21.3* 19.8*  INR 1.9* 1.7*      Assessment/Plan: -Ascites with lower extremity edema.  Could be from underlying cirrhosis from alcohol use.  Underwent paracentesis yesterday with removal of 1.5 L of fluid. negative for SBP. -Abnormal LFTs.  Most likely from  underlying alcohol use,/alcoholic hepatitis.  Hepatitis panel negative -Atrial flutter with RVR.  Converted to sinus rhythm. -Intermittent rectal bleeding per patient.  Occult blood was negative on admission.  Recommendations ----------------------- -Patient with discriminant function score of 27.4 today   Hold off on starting steroids for now. -Therapeutic paracentesis today with removal of 8 L of fluids.  50 g albumin after paracentesis. -Increase Lasix to 40 mg/day and spironolactone 50 mg/day. -Absolute alcohol abstinence advised.  Recommend 2 g sodium diet. -Recommend outpatient EGD and colonoscopy.  -Possibility of underlying cirrhosis discussed with the patient.  He verbalized understanding.  -GI will follow   Otis Brace MD, [place] 05/08/2019, 11:07 AM  Contact #  [phone]

## 2019-05-08 NOTE — Progress Notes (Signed)
PROGRESS NOTE    Henry [name]  TJQ:[id number] DOB: 01/11/1961 DOA: 05/05/2019 PCP: Patient, No Pcp Per    Brief Narrative:  Patient is 58 year old male with history of hypertension, traumatic left lower extremity amputation, heavy alcohol abuse who presented to the emergency room secondary to generalized weakness, abdominal distention and right leg swelling for more than 3 weeks, also with exertional dyspnea.  Patient does drink 1 paint of whiskey per day.  In the emergency room he was found to be in a flutter, has significant ascites, patient was found with lactic acid of 4, mildly elevated AST ALT, total bilirubin of 4.8.  CT abdomen pelvis significant for hepatomegaly/steatosis with ascites.   Assessment & Plan:   Active Problems:   Alcoholic liver failure (HCC)   Atrial flutter (HCC)   Alcoholic cirrhosis of liver with ascites (HCC)   Tobacco abuse  Paroxysmal a flutter with RVR: Converted to normal sinus rhythm.  Patient initially received amiodarone and Cardizem infusion.  Currently on oral Cardizem and sinus rhythm.  Echocardiogram with normal ejection fraction.  Aspirin for prophylaxis.  Not reliable candidate for therapeutic anticoagulation.    Alcoholic liver disease/decompensated ascites: Hepatitis panel negative.  Consistent with alcoholic liver disease.  Supportive treatment. Lactulose as needed. Started on Lasix and Aldactone. Followed by gastroenterology. Initial paracentesis yielded 1.5 L transudate.  No growth.  No evidence of infection. Patient has persistence tense ascites, I sent him for paracentesis again today, 8 L removed.  Albumin 50 g 1 dose. Recheck renal function test and LFTs in the morning.  Smoker: Counseled to quit.  On nicotine patch.  Alcoholism with alcohol-related disorder/risk of alcohol withdrawal: Remains on multivitamins.  Remains on benzodiazepine based CIWA protocol.  High risk of withdrawal.  Patient was extensively counseled.  We will  continue to monitor.  Patient will need PCP and cardiology follow-up as well as GI follow-up.  Case management to help with medications.  Since he had large volume paracentesis today, will need monitoring with albumin infusion. Anticipate go home tomorrow if remains stable.  DVT prophylaxis: SCDs Code Status: Full code Family Communication: None Disposition Plan: Home.  Anticipate tomorrow.   Consultants:   Cardiology  Gastroenterology  Procedures:   Paracentesis, transudate 1.5 L on 05/06/2019  Paracentesis, transudate 8 L on 05/08/2019  Antimicrobials:   Levaquin, 05/05/2019--- stopped   Subjective: Patient seen and examined.  No new events.  Remains sinus rhythm.  Denies any chest pain.  Bloated and occasional nausea.  Objective: Vitals:   05/07/19 1932 05/07/19 2321 05/08/19 0458 05/08/19 0745  BP: (!) 127/95 (!) 136/98 108/82 131/89  Pulse: (!) 102 (!) 105 97 96  Resp: (!) 23 19 20 19   Temp: 98.2 F (36.8 C) 97.7 F (36.5 C) 98.4 F (36.9 C) 98.2 F (36.8 C)  TempSrc: Oral Oral Oral Oral  SpO2: 100% 99% 96% 98%  Weight:   84 kg   Height:        Intake/Output Summary (Last 24 hours) at 05/08/2019 1257 Last data filed at 05/08/2019 0711 Gross per 24 hour  Intake --  Output 175 ml  Net -175 ml   Filed Weights   05/05/19 1305 05/07/19 0640 05/08/19 0458  Weight: 79.4 kg 87 kg 84 kg    Examination:  General exam: Appears calm and comfortable, chronically sick looking.  On room air. Respiratory system: Clear to auscultation. Respiratory effort normal. Cardiovascular system: S1 & S2 heard, RRR. No JVD, murmurs, rubs, gallops or clicks.  2+  edema right leg. Gastrointestinal system: Abdomen is distended, tense ascites before drain.  Soft and nontender. No organomegaly or masses felt. Normal bowel sounds heard. Central nervous system: Alert and oriented. No focal neurological deficits. Extremities: Symmetric 5 x 5 power.  Left below-knee amputation stump clean  and dry. Skin: No rashes, lesions or ulcers Psychiatry: Judgement and insight appear normal. Mood & affect appropriate.     Data Reviewed: I have personally reviewed following labs and imaging studies  CBC: Recent Labs  Lab 05/05/19 1327 05/06/19 0532 05/07/19 0202  WBC 12.3* 14.3* 13.0*  NEUTROABS 9.2*  --   --   HGB 13.3 11.4* 11.1*  HCT 37.3* 32.8* 30.7*  MCV 111.3* 110.8* 110.8*  PLT 223 213 185   Basic Metabolic Panel: Recent Labs  Lab 05/05/19 1327 05/06/19 0532 05/07/19 0202 05/08/19 0514  NA 133* 132* 129* 130*  K 3.7 4.0 3.9 3.5  CL 98 99 96* 98  CO2 22 22 21* 23  GLUCOSE 118* 126* 110* 98  BUN 6 7 8 7   CREATININE 0.56* 0.61 0.76 0.69  CALCIUM 8.0* 7.8* 8.1* 8.0*  MG  --   --  1.4*  --    GFR: Estimated Creatinine Clearance: 106.2 mL/min (by C-G formula based on SCr of 0.69 mg/dL). Liver Function Tests: Recent Labs  Lab 05/05/19 1327 05/06/19 0532 05/07/19 0202 05/08/19 0514  AST 219* 183* 129* 93*  ALT 76* 64* 55* 43  ALKPHOS 407* 347* 291* 263*  BILITOT 4.8* 6.6* 6.5* 6.2*  PROT 7.3 6.6 5.9* 5.5*  ALBUMIN 2.7* 2.5* 2.0* 1.9*   Recent Labs  Lab 05/05/19 1327  LIPASE 37   Recent Labs  Lab 05/05/19 1328  AMMONIA 59*   Coagulation Profile: Recent Labs  Lab 05/05/19 1327 05/07/19 0202 05/08/19 0514  INR 1.3* 1.9* 1.7*   Cardiac Enzymes: No results for input(s): CKTOTAL, CKMB, CKMBINDEX, TROPONINI in the last 168 hours. BNP (last 3 results) No results for input(s): PROBNP in the last 8760 hours. HbA1C: No results for input(s): HGBA1C in the last 72 hours. CBG: Recent Labs  Lab 05/06/19 2120  GLUCAP 119*   Lipid Profile: No results for input(s): CHOL, HDL, LDLCALC, TRIG, CHOLHDL, LDLDIRECT in the last 72 hours. Thyroid Function Tests: No results for input(s): TSH, T4TOTAL, FREET4, T3FREE, THYROIDAB in the last 72 hours. Anemia Panel: No results for input(s): VITAMINB12, FOLATE, FERRITIN, TIBC, IRON, RETICCTPCT in the last 72  hours. Sepsis Labs: Recent Labs  Lab 05/05/19 1328 05/05/19 1639  LATICACIDVEN 4.0* 3.9*    Recent Results (from the past 240 hour(s))  SARS CORONAVIRUS 2 (TAT 6-24 HRS) Nasopharyngeal Nasopharyngeal Swab     Status: None   Collection Time: 05/05/19  3:38 PM   Specimen: Nasopharyngeal Swab  Result Value Ref Range Status   SARS Coronavirus 2 NEGATIVE NEGATIVE Final    Comment: (NOTE) SARS-CoV-2 target nucleic acids are NOT DETECTED. The SARS-CoV-2 RNA is generally detectable in upper and lower respiratory specimens during the acute phase of infection. Negative results do not preclude SARS-CoV-2 infection, do not rule out co-infections with other pathogens, and should not be used as the sole basis for treatment or other patient management decisions. Negative results must be combined with clinical observations, patient history, and epidemiological information. The expected result is Negative. Fact Sheet for Patients: HairSlick.no Fact Sheet for Healthcare Providers: quierodirigir.com This test is not yet approved or cleared by the [place] FDA and  has been authorized for detection and/or diagnosis of SARS-CoV-2  by FDA under an Emergency Use Authorization (EUA). This EUA will remain  in effect (meaning this test can be used) for the duration of the COVID-19 declaration under Section 56 4(b)(1) of the Act, 21 U.S.C. section 360bbb-3(b)(1), unless the authorization is terminated or revoked sooner. Performed at [facility], [place] [address] [place], [place] [address]   Blood Culture (routine x 2)     Status: None (Preliminary result)   Collection Time: 05/05/19  4:33 PM   Specimen: BLOOD LEFT ARM  Result Value Ref Range Status   Specimen Description BLOOD LEFT ARM  Final   Special Requests   Final    BOTTLES DRAWN AEROBIC AND ANAEROBIC Blood Culture adequate volume   Culture   Final    NO GROWTH 3  DAYS Performed at [facility], [address], [place], [place] [address]    Report Status PENDING  Incomplete  Blood Culture (routine x 2)     Status: None (Preliminary result)   Collection Time: 05/05/19  4:39 PM   Specimen: BLOOD LEFT WRIST  Result Value Ref Range Status   Specimen Description BLOOD LEFT WRIST  Final   Special Requests   Final    BOTTLES DRAWN AEROBIC AND ANAEROBIC Blood Culture adequate volume   Culture   Final    NO GROWTH 3 DAYS Performed at [facility], [address] [place], [place] [address]    Report Status PENDING  Incomplete  Culture, body fluid-bottle     Status: None (Preliminary result)   Collection Time: 05/06/19  4:07 PM   Specimen: Peritoneal Washings  Result Value Ref Range Status   Specimen Description PERITONEAL  Final   Special Requests NONE  Final   Culture   Final    NO GROWTH 2 DAYS Performed at [facility], [place] [address], [place], [place] [address]    Report Status PENDING  Incomplete  Gram stain     Status: None   Collection Time: 05/06/19  4:07 PM   Specimen: Peritoneal Washings  Result Value Ref Range Status   Specimen Description PERITONEAL  Final   Special Requests NONE  Final   Gram Stain   Final    NO WBC SEEN NO ORGANISMS SEEN Performed at [facility], [address] [place], [place] [address]    Report Status 05/07/2019 FINAL  Final         Radiology Studies: US Paracentesis  Result Date: 05/07/2019 INDICATION: Patient with history of heavy alcohol abuse, cirrhosis with new onset abdominal distension. Request IR for diagnostic and therapeutic paracentesis. EXAM: ULTRASOUND GUIDED DIAGNOSTIC AND THERAPEUTIC PARACENTESIS MEDICATIONS: 8 mL 1% lidocaine COMPLICATIONS: None immediate. PROCEDURE: Informed written consent was obtained from the patient after a discussion of the risks, benefits and alternatives to treatment. A timeout was performed prior to the initiation of the procedure. Initial ultrasound  scanning demonstrates a large amount of ascites within the left lower abdominal quadrant. The left lower abdomen was prepped and draped in the usual sterile fashion. 1% lidocaine was used for local anesthesia. Following this, a 19 gauge, 7-cm, Yueh catheter was introduced. An ultrasound image was saved for documentation purposes. The paracentesis was performed. The catheter was removed and a dressing was applied. The patient tolerated the procedure well without immediate post procedural complication. FINDINGS: A total of approximately 1.5 L of clear yellow fluid was removed. Samples were sent to the laboratory as requested by the clinical team. IMPRESSION: Successful ultrasound-guided paracentesis yielding  1.5 liters of peritoneal fluid. Read by Lynnette CaffeyShannon Watterson, PA-C Electronically Signed   By: Corlis Leak  Hassell M.D.   On: 05/06/2019 16:02        Scheduled Meds:  aspirin EC  81 mg Oral Daily   diltiazem  360 mg Oral Daily   folic acid  1 mg Oral Daily   [START ON 05/09/2019] furosemide  40 mg Oral Daily   lactulose  20 g Oral BID   lidocaine       LORazepam  0-4 mg Intravenous Q8H   multivitamin with minerals  1 tablet Oral Daily   nicotine  21 mg Transdermal Daily   pneumococcal 23 valent vaccine  0.5 mL Intramuscular Tomorrow-1000   [START ON 05/09/2019] spironolactone  50 mg Oral Daily   thiamine  100 mg Oral Daily   Continuous Infusions:  albumin human       LOS: 3 days    Time spent: 25 minutes    Dorcas CarrowKuber Rykin Route, MD [facility] Pager [phone]  If 7PM-7AM, please contact night-coverage [email or web address] Password [email or web address] 05/08/2019, 12:57 PM

## 2019-05-08 NOTE — Progress Notes (Signed)
Progress Note  Patient Name: Henry [name] Date of Encounter: 05/08/2019  Primary Cardiologist: Nona DellSamuel McDowell, MD   Subjective   No complaints no chest pain, breathing stable.   Inpatient Medications    Scheduled Meds: . aspirin EC  81 mg Oral Daily  . diltiazem  360 mg Oral Daily  . folic acid  1 mg Oral Daily  . furosemide  20 mg Oral Daily  . influenza vac split quadrivalent PF  0.5 mL Intramuscular Tomorrow-1000  . lactulose  20 g Oral BID  . LORazepam  0-4 mg Intravenous Q8H  . multivitamin with minerals  1 tablet Oral Daily  . nicotine  21 mg Transdermal Daily  . pneumococcal 23 valent vaccine  0.5 mL Intramuscular Tomorrow-1000  . spironolactone  25 mg Oral Daily  . thiamine  100 mg Oral Daily   Continuous Infusions:  PRN Meds: LORazepam **OR** LORazepam, ondansetron **OR** ondansetron (ZOFRAN) IV   Vital Signs    Vitals:   05/07/19 1932 05/07/19 2321 05/08/19 0458 05/08/19 0745  BP: (!) 127/95 (!) 136/98 108/82 131/89  Pulse: (!) 102 (!) 105 97 96  Resp: (!) 23 19 20 19   Temp: 98.2 F (36.8 C) 97.7 F (36.5 C) 98.4 F (36.9 C) 98.2 F (36.8 C)  TempSrc: Oral Oral Oral Oral  SpO2: 100% 99% 96% 98%  Weight:   84 kg   Height:        Intake/Output Summary (Last 24 hours) at 05/08/2019 0754 Last data filed at 05/08/2019 0711 Gross per 24 hour  Intake 296.5 ml  Output 375 ml  Net -78.5 ml   Last 3 Weights 05/08/2019 05/07/2019 05/05/2019  Weight (lbs) 185 lb 3.2 oz 191 lb 11.2 oz 175 lb  Weight (kg) 84.006 kg 86.955 kg 79.379 kg      Telemetry    SR with freq PACs on dilt  - Personally Reviewed  ECG    No new - Personally Reviewed  Physical Exam   GEN: No acute distress.   Neck: No JVD Cardiac: RRR, with premature beats, no murmurs, rubs, or gallops.  Respiratory: Clear to auscultation bilaterally. GI: Soft, nontender, + ascites, seems larger today  MS: 1+ Rt lower ext edema; Lt BKA with prosthesis Neuro:  Nonfocal  Psych: Normal  affect   Labs    High Sensitivity Troponin:  No results for input(s): TROPONINIHS in the last 720 hours.    Chemistry Recent Labs  Lab 05/06/19 0532 05/07/19 0202 05/08/19 0514  NA 132* 129* 130*  K 4.0 3.9 3.5  CL 99 96* 98  CO2 22 21* 23  GLUCOSE 126* 110* 98  BUN 7 8 7   CREATININE 0.61 0.76 0.69  CALCIUM 7.8* 8.1* 8.0*  PROT 6.6 5.9* 5.5*  ALBUMIN 2.5* 2.0* 1.9*  AST 183* 129* 93*  ALT 64* 55* 43  ALKPHOS 347* 291* 263*  BILITOT 6.6* 6.5* 6.2*  GFRNONAA >60 >60 >60  GFRAA >60 >60 >60  ANIONGAP 11 12 9      Hematology Recent Labs  Lab 05/05/19 1327 05/06/19 0532 05/07/19 0202  WBC 12.3* 14.3* 13.0*  RBC 3.35* 2.96* 2.77*  HGB 13.3 11.4* 11.1*  HCT 37.3* 32.8* 30.7*  MCV 111.3* 110.8* 110.8*  MCH 39.7* 38.5* 40.1*  MCHC 35.7 34.8 36.2*  RDW 18.3* 18.4* 18.1*  PLT 223 213 185    BNPNo results for input(s): BNP, PROBNP in the last 168 hours.   DDimer No results for input(s): DDIMER in the last 168 hours.  Radiology    US Paracentesis  Result Date: 05/07/2019 INDICATION: Patient with history of heavy alcohol abuse, cirrhosis with new onset abdominal distension. Request IR for diagnostic and therapeutic paracentesis. EXAM: ULTRASOUND GUIDED DIAGNOSTIC AND THERAPEUTIC PARACENTESIS MEDICATIONS: 8 mL 1% lidocaine COMPLICATIONS: None immediate. PROCEDURE: Informed written consent was obtained from the patient after a discussion of the risks, benefits and alternatives to treatment. A timeout was performed prior to the initiation of the procedure. Initial ultrasound scanning demonstrates a large amount of ascites within the left lower abdominal quadrant. The left lower abdomen was prepped and draped in the usual sterile fashion. 1% lidocaine was used for local anesthesia. Following this, a 19 gauge, 7-cm, Yueh catheter was introduced. An ultrasound image was saved for documentation purposes. The paracentesis was performed. The catheter was removed and a dressing was  applied. The patient tolerated the procedure well without immediate post procedural complication. FINDINGS: A total of approximately 1.5 L of clear yellow fluid was removed. Samples were sent to the laboratory as requested by the clinical team. IMPRESSION: Successful ultrasound-guided paracentesis yielding 1.5 liters of peritoneal fluid. Read by Lynnette Caffey, PA-C Electronically Signed   By: Corlis Leak M.D.   On: 05/06/2019 16:02    Cardiac Studies  Echo 05/06/09 IMPRESSIONS    1. Images are limited.  2. Left ventricular ejection fraction, by visual estimation, is 60 to 65%. The left ventricle has normal function. Normal left ventricular size. There is moderately increased left ventricular hypertrophy.  3. Left ventricular diastolic Doppler parameters are consistent with impaired relaxation pattern of LV diastolic filling.  4. Global right ventricle has normal systolic function.The right ventricular size is normal. No increase in right ventricular wall thickness.  5. Left atrial size was normal.  6. Right atrial size was normal.  7. Presence of pericardial fat pad.  8. The mitral valve is grossly normal. Trace mitral valve regurgitation.  9. The tricuspid valve is grossly normal. Tricuspid valve regurgitation is trivial. 10. The aortic valve was not well visualized Aortic valve regurgitation was not visualized by color flow Doppler. 11. The pulmonic valve was not well visualized. Pulmonic valve regurgitation was not assessed by color flow Doppler. 12. Aortic dilatation noted. 13. There is mild dilatation of the ascending aorta. 14. The interatrial septum was not well visualized.  FINDINGS  Left Ventricle: Left ventricular ejection fraction, by visual estimation, is 60 to 65%. The left ventricle has normal function. There is moderately increased left ventricular hypertrophy. Normal left ventricular size. Spectral Doppler shows Left  ventricular diastolic Doppler parameters are  consistent with impaired relaxation pattern of LV diastolic filling.  Right Ventricle: The right ventricular size is normal. No increase in right ventricular wall thickness. Global RV systolic function is has normal systolic function.  Left Atrium: Left atrial size was normal in size.  Right Atrium: Right atrial size was normal in size  Pericardium: There is no evidence of pericardial effusion. Presence of pericardial fat pad.  Mitral Valve: The mitral valve is grossly normal. There is mild calcification of the mitral valve leaflet(s). MV peak gradient, 3.8 mmHg. Trace mitral valve regurgitation.  Tricuspid Valve: The tricuspid valve is grossly normal. Tricuspid valve regurgitation is trivial by color flow Doppler.  Aortic Valve: The aortic valve was not well visualized. Aortic valve regurgitation was not visualized by color flow Doppler. Aortic valve mean gradient measures 3.0 mmHg. Aortic valve peak gradient measures 5.5 mmHg. Aortic valve area, by VTI measures  3.23 cm.  Pulmonic Valve: The  pulmonic valve was not well visualized. Pulmonic valve regurgitation was not assessed by color flow Doppler.  Aorta: Aortic dilatation noted. There is mild dilatation of the ascending aorta.  Venous: The inferior vena cava was not well visualized.  IAS/Shunts: The interatrial septum was not well visualized.    Patient Profile     58 y.o. male with a hx of HTN, heavy ETOH abuse, traumatic LL ext amputation now admitted with ascites/alcoholic liver failure  and was in a flutter. On no medications on admit.  Assessment & Plan    1. PA flutter now in SR - echo with normal EF,on po dilt and IV heparin.  CHA2DS2VASc of 1-2  (elevated blood pressure without diagnosis of hypertension, mild atherosclerosis by CT imaging).    Pt may have been in a flutter a month prior to admit per his description of HR.  He converted with IV dilt and one bolus of amiodarone.  Will convert to cardiazem CD  360 mg today.  With his non compliance may need to use ASA alone unless he agrees to be compliant with medical care.   2. Ascites with paracentesis 05/06/19 with 1.5 L  --still with volume overload at +2749. Is on lasix 20 mg daily and spironolactone 25 3. Alcoholic cirrhosis of liver in work up 4. SIRS/colitis septic workup 5. Hyponatremia today Na 130 up from 129  6. Tobacco abuse per IM with nicoderm patch 7. Hx of noncompliance 8. Mg+ yesterday 1.4 would replace       For questions or updates, please contact [place] Please consult [email or web address] for contact info under        Signed, Cecilie Kicks, NP  05/08/2019, 7:54 AM

## 2019-05-08 NOTE — Procedures (Signed)
PROCEDURE SUMMARY:  Successful US guided paracentesis from right lateral abdomen.  Yielded 1.4 liters of yellow fluid.  No immediate complications.  Pt tolerated well.   Specimen was not sent for labs.  EBL < 74mL  Docia Barrier PA-C 05/08/2019 11:42 AM

## 2019-05-08 NOTE — Care Management (Signed)
1531 05-08-19 CM received referral for new PCP- CM did speak with patient and he can not remember provider that was assigned to him via Medicaid. CM did verify if patient wants [place] prefers [place] called the [facility] @ [phone] . The clinic supervisor faxed a new patient application over. Appointment will not be scheduled until the application has been turned in. Patient is aware. Per patient he is from home with family. CM did provide patient with information on transportation in [place]. No further needs from CM at this time. Bethena Roys, RN,BSN Case Manager [phone]

## 2019-05-09 LAB — COMPREHENSIVE METABOLIC PANEL
ALT: 36 U/L (ref 0–44)
AST: 79 U/L — ABNORMAL HIGH (ref 15–41)
Albumin: 2.5 g/dL — ABNORMAL LOW (ref 3.5–5.0)
Alkaline Phosphatase: 239 U/L — ABNORMAL HIGH (ref 38–126)
Anion gap: 10 (ref 5–15)
BUN: 7 mg/dL (ref 6–20)
CO2: 24 mmol/L (ref 22–32)
Calcium: 8.1 mg/dL — ABNORMAL LOW (ref 8.9–10.3)
Chloride: 98 mmol/L (ref 98–111)
Creatinine, Ser: 0.63 mg/dL (ref 0.61–1.24)
GFR calc Af Amer: >60 mL/min (ref >60)
GFR calc non Af Amer: >60 mL/min (ref >60)
Glucose, Bld: 95 mg/dL (ref 70–99)
Potassium: 3.4 mmol/L — ABNORMAL LOW (ref 3.5–5.1)
Sodium: 132 mmol/L — ABNORMAL LOW (ref 135–145)
Total Bilirubin: 7.6 mg/dL — ABNORMAL HIGH (ref 0.3–1.2)
Total Protein: 5.5 g/dL — ABNORMAL LOW (ref 6.5–8.1)

## 2019-05-09 LAB — CYTOLOGY - NON PAP

## 2019-05-09 LAB — CBC WITH DIFFERENTIAL/PLATELET
Abs Immature Granulocytes: 0.05 10*3/uL (ref 0.00–0.07)
Basophils Absolute: 0 10*3/uL (ref 0.0–0.1)
Basophils Relative: 0 %
Eosinophils Absolute: 0 10*3/uL (ref 0.0–0.5)
Eosinophils Relative: 0 %
HCT: 29.2 % — ABNORMAL LOW (ref 39.0–52.0)
Hemoglobin: 10.3 g/dL — ABNORMAL LOW (ref 13.0–17.0)
Immature Granulocytes: 0 %
Lymphocytes Relative: 9 %
Lymphs Abs: 1.1 10*3/uL (ref 0.7–4.0)
MCH: 39.8 pg — ABNORMAL HIGH (ref 26.0–34.0)
MCHC: 35.3 g/dL (ref 30.0–36.0)
MCV: 112.7 fL — ABNORMAL HIGH (ref 80.0–100.0)
Monocytes Absolute: 0.8 10*3/uL (ref 0.1–1.0)
Monocytes Relative: 7 %
Neutro Abs: 9.4 10*3/uL — ABNORMAL HIGH (ref 1.7–7.7)
Neutrophils Relative %: 84 %
Platelets: 209 10*3/uL (ref 150–400)
RBC: 2.59 MIL/uL — ABNORMAL LOW (ref 4.22–5.81)
RDW: 18.3 % — ABNORMAL HIGH (ref 11.5–15.5)
WBC: 11.3 10*3/uL — ABNORMAL HIGH (ref 4.0–10.5)
nRBC: 0.2 % (ref 0.0–0.2)

## 2019-05-09 MED ORDER — DILTIAZEM HCL ER COATED BEADS 360 MG PO CP24: 360.0000 mg | ORAL_CAPSULE | Freq: Every day | ORAL | 2 refills | Status: AC

## 2019-05-09 MED ORDER — FOLIC ACID 1 MG PO TABS: 1.0000 mg | ORAL_TABLET | Freq: Every day | ORAL | Status: AC

## 2019-05-09 MED ORDER — ADULT MULTIVITAMIN W/MINERALS CH: 1.0000 | ORAL_TABLET | Freq: Every day | ORAL | Status: AC

## 2019-05-09 MED ORDER — FUROSEMIDE 40 MG PO TABS: 40.0000 mg | ORAL_TABLET | Freq: Every day | ORAL | 2 refills | Status: AC

## 2019-05-09 MED ORDER — LACTULOSE 10 GM/15ML PO SOLN: 20.0000 g | Freq: Two times a day (BID) | ORAL | 0 refills | Status: DC

## 2019-05-09 MED ORDER — ASPIRIN 81 MG PO TBEC: 81.0000 mg | DELAYED_RELEASE_TABLET | Freq: Every day | ORAL | Status: AC

## 2019-05-09 MED ORDER — SPIRONOLACTONE 50 MG PO TABS: 50.0000 mg | ORAL_TABLET | Freq: Every day | ORAL | 2 refills | Status: AC

## 2019-05-09 NOTE — Discharge Summary (Signed)
Physician Discharge Summary  FERNANDEZ KENLEY ZCH:[id number] DOB: 1960-09-16 DOA: 05/05/2019  PCP: Patient, No Pcp Per  Admit date: 05/05/2019 Discharge date: 05/09/2019  Admitted From: home  Disposition:  Home   Recommendations for Outpatient Follow-up:  1. Follow up with PCP in 1-2 weeks 2. Please obtain CMP/CBC in one week 3. Follow-up with gastroenterology in 2 to 4 weeks.  Home Health: Not applicable Equipment/Devices: Not applicable  Discharge Condition: Fair CODE STATUS: Full code Diet recommendation: Low-salt diet.  Low fluid intake, less than 1500 mL in a day.  Brief/Interim Summary: Patient is 58 year old male with history of hypertension, traumatic left lower extremity amputation, heavy alcohol abuse who presented to the emergency room secondary to generalized weakness, abdominal distention and right leg swelling for more than 3 weeks, also with exertional dyspnea.  Patient was drinking 1 paint of whiskey per day for long time.  In the emergency room he was found to be in a-flutter, has significant ascites, patient was found with lactic acid of 4, mildly elevated AST ALT, total bilirubin of 4.8.  CT abdomen pelvis significant for hepatomegaly/steatosis with ascites.  He was admitted to the hospital and treated for multiple conditions as below.  Discharge Diagnoses:  Active Problems:   Alcoholic liver failure (HCC)   Atrial flutter (HCC)   Alcoholic cirrhosis of liver with ascites (HCC)   Tobacco abuse  Paroxysmal a flutter with RVR: Converted to sinus rhythm.  Remains very well rate controlled.  Ejection fraction normal on 2D echocardiogram.CHADSVACS score of 1.  Cardizem 360 mg daily, aspirin 81 mg daily.  Chronic alcoholic liver disease/decompensated liver disease with ascites: Hepatitis panel negative.  Consistent with alcohol liver disease.  No encephalopathy. Paracentesis x2, each time 1.5 L removed, transudate with no evidence of infection. Started on Aldactone 50 mg,  Lasix 40 mg, lactulose twice a day as needed, complete abstinence from alcohol.  Currently remains stable.  Was seen and followed by gastroenterology in the hospital. [facility] will schedule outpatient follow-up for evaluation, will need EGD and colonoscopy. Fortunately, he did not show any evidence of florid alcohol withdrawal.  Will remain on multivitamins and folic acid. Also counseled to quit smoking.  Patient has not been following up with any providers, no maintenance health care.  Case manager helped him to get to primary care physician.  Extensive counseling done and patient is now motivated to be compliant and stop using alcohol and smoking.  Discharge Instructions  Discharge Instructions    Call MD for:  difficulty breathing, headache or visual disturbances   Complete by: As directed    Call MD for:  persistant nausea and vomiting   Complete by: As directed    Diet - low sodium heart healthy   Complete by: As directed    Discharge instructions   Complete by: As directed    Stop drinking any alcohol altogether   Increase activity slowly   Complete by: As directed      Allergies as of 05/09/2019   No Known Allergies     Medication List    STOP taking these medications   diltiazem 180 MG 24 hr capsule Commonly known as: TIAZAC   lisinopril 10 MG tablet Commonly known as: ZESTRIL     TAKE these medications   aspirin 81 MG EC tablet Take 1 tablet (81 mg total) by mouth daily. Start taking on: May 10, 2019   diltiazem 360 MG 24 hr capsule Commonly known as: CARDIZEM CD Take 1 capsule (  360 mg total) by mouth daily. Start taking on: May 10, 2019   folic acid 1 MG tablet Commonly known as: FOLVITE Take 1 tablet (1 mg total) by mouth daily. Start taking on: May 10, 2019   furosemide 40 MG tablet Commonly known as: LASIX Take 1 tablet (40 mg total) by mouth daily. Start taking on: May 10, 2019   lactulose 10 GM/15ML  solution Commonly known as: CHRONULAC Take 30 mLs (20 g total) by mouth 2 (two) times daily.   multivitamin with minerals Tabs tablet Take 1 tablet by mouth daily. Start taking on: May 10, 2019   spironolactone 50 MG tablet Commonly known as: ALDACTONE Take 1 tablet (50 mg total) by mouth daily. Start taking on: May 10, 2019       No Known Allergies  Consultations:  Gastroenterology, [facility] physicians service.   Procedures/Studies: Dg Chest 2 View  Result Date: 05/05/2019 CLINICAL DATA:  Palpitations EXAM: CHEST - 2 VIEW COMPARISON:  None. FINDINGS: The heart size and mediastinal contours are within normal limits. No focal consolidation, pleural effusion, or pneumothorax. IMPRESSION: No active cardiopulmonary disease. Electronically Signed   By: Duanne Guess M.D.   On: 05/05/2019 15:36   Ct Abdomen Pelvis W Contrast  Result Date: 05/05/2019 CLINICAL DATA:  Acute abdominal pain for 2 weeks EXAM: CT ABDOMEN AND PELVIS WITH CONTRAST TECHNIQUE: Multidetector CT imaging of the abdomen and pelvis was performed using the standard protocol following bolus administration of intravenous contrast. CONTRAST:  OMNIPAQUE IOHEXOL 300 MG/ML  SOLN COMPARISON:  None. FINDINGS: Lower chest: The visualized heart size within normal limits. No pericardial fluid/thickening. No hiatal hernia. Small amount of streaky atelectasis at both lung bases. Hepatobiliary: There is diffuse low density seen throughout the liver parenchyma. The liver appears to be enlarged measuring 20 cm in craniocaudal dimension. No focal hepatic lesion however is seen.The main portal vein is patent. No evidence of calcified gallstones, gallbladder wall thickening or biliary dilatation. Pancreas: Unremarkable. No pancreatic ductal dilatation or surrounding inflammatory changes. Spleen: Normal in size without focal abnormality. Adrenals/Urinary Tract: Both adrenal glands appear normal. The kidneys and collecting  system appear normal without evidence of urinary tract calculus or hydronephrosis. Bladder is unremarkable. Stomach/Bowel: The stomach and small bowel are normal in appearance. There is diffuse bowel wall edema noted within the hepatic flexure and cecum. The sigmoid colon appears to be decompressed and narrowed. Vascular/Lymphatic: There is a moderate amount of perihepatic and pericolonic ascites extending into the deep pelvis. Scattered aortic atherosclerotic calcifications are seen without aneurysmal dilatation. Reproductive: The prostate is unremarkable. Other: No evidence of abdominal wall mass or hernia. Musculoskeletal: No acute or significant osseous findings. IMPRESSION: 1. Hepatic steatosis and hepatomegaly 2. Moderate abdominopelvic ascites 3. Edematous appearance to the hepatic flexure and cecum. Findings may be infectious, inflammatory, or secondary to hypoalbuminemia. Electronically Signed   By: Jonna Clark M.D.   On: 05/05/2019 15:20   US Paracentesis  Result Date: 05/07/2019 INDICATION: Patient with history of heavy alcohol abuse, cirrhosis with new onset abdominal distension. Request IR for diagnostic and therapeutic paracentesis. EXAM: ULTRASOUND GUIDED DIAGNOSTIC AND THERAPEUTIC PARACENTESIS MEDICATIONS: 8 mL 1% lidocaine COMPLICATIONS: None immediate. PROCEDURE: Informed written consent was obtained from the patient after a discussion of the risks, benefits and alternatives to treatment. A timeout was performed prior to the initiation of the procedure. Initial ultrasound scanning demonstrates a large amount of ascites within the left lower abdominal quadrant. The left lower abdomen was prepped and draped in the  usual sterile fashion. 1% lidocaine was used for local anesthesia. Following this, a 19 gauge, 7-cm, Yueh catheter was introduced. An ultrasound image was saved for documentation purposes. The paracentesis was performed. The catheter was removed and a dressing was applied. The patient  tolerated the procedure well without immediate post procedural complication. FINDINGS: A total of approximately 1.5 L of clear yellow fluid was removed. Samples were sent to the laboratory as requested by the clinical team. IMPRESSION: Successful ultrasound-guided paracentesis yielding 1.5 liters of peritoneal fluid. Read by Lynnette Caffey, PA-C Electronically Signed   By: Corlis Leak M.D.   On: 05/06/2019 16:02   US Abdomen Limited Ruq  Result Date: 05/05/2019 CLINICAL DATA:  Right upper quadrant pain EXAM: ULTRASOUND ABDOMEN LIMITED RIGHT UPPER QUADRANT COMPARISON:  Same-day CT FINDINGS: Gallbladder: No gallstones or wall thickening visualized. No sonographic Murphy sign noted by sonographer. Common bile duct: Diameter: 3 mm Liver: Liver is enlarged and diffusely echogenic. Evaluation of the liver is significantly limited secondary to poor penetration. Within these limitations, no discrete hepatic lesion was identified. Doppler evaluation of the portal vein was unable to be performed. Other: Small volume ascites within the upper abdomen. IMPRESSION: 1. Hepatomegaly and hepatic steatosis. Evaluation of the liver was limited secondary to poor penetration. Blood flow within the portal vein was unable to be evaluated. CT of the abdomen and pelvis performed on the same day revealed a patent portal vein. 2. Small volume ascites within the upper abdomen. Electronically Signed   By: Duanne Guess M.D.   On: 05/05/2019 15:58   Ir Paracentesis  Result Date: 05/08/2019 INDICATION: Patient with history of alcohol use, cirrhosis, abdominal distension. Request is made for therapeutic paracentesis of up to 8 L maximum. EXAM: ULTRASOUND GUIDED THERAPEUTIC PARACENTESIS MEDICATIONS: 10 mL 1% lidocaine COMPLICATIONS: None immediate. PROCEDURE: Informed written consent was obtained from the patient after a discussion of the risks, benefits and alternatives to treatment. A timeout was performed prior to the initiation of  the procedure. Initial ultrasound scanning demonstrates a small amount of ascites within the right lower abdominal quadrant. The right lower abdomen was prepped and draped in the usual sterile fashion. 1% lidocaine was used for local anesthesia. Following this, a 19 gauge, 7-cm, Yueh catheter was introduced. An ultrasound image was saved for documentation purposes. The paracentesis was performed. The catheter was removed and a dressing was applied. The patient tolerated the procedure well without immediate post procedural complication. FINDINGS: A total of approximately 1.4 liters of bright yellow fluid was removed. IMPRESSION: Successful ultrasound-guided therapeutic paracentesis yielding 1.4 liters of peritoneal fluid. Read by: Loyce Dys PA-C Electronically Signed   By: Richarda Overlie M.D.   On: 05/08/2019 13:02      Subjective: Patient seen and examined.  No overnight events.  Remains in sinus rhythm.  Abdomen is still bloated, however he was able to eat regular diet, normal bowel movement overnight.   Discharge Exam: Vitals:   05/08/19 2120 05/09/19 0634  BP: 109/83 109/82  Pulse: 93 (!) 102  Resp: 19 19  Temp: 98.4 F (36.9 C) 99.3 F (37.4 C)  SpO2: 99% 96%   Vitals:   05/08/19 1301 05/08/19 1545 05/08/19 2120 05/09/19 0634  BP: (!) 146/101 128/90 109/83 109/82  Pulse: (!) 102 90 93 (!) 102  Resp: 19 20 19 19   Temp: 98.2 F (36.8 C) 98.3 F (36.8 C) 98.4 F (36.9 C) 99.3 F (37.4 C)  TempSrc: Oral Oral Oral Oral  SpO2: 98% 97% 99% 96%  Weight:    82.2 kg  Height:        General: Pt is alert, awake, not in acute distress, older than his stated age.  On room air.  Walking alone. Cardiovascular: RRR, S1/S2 +, no rubs, no gallops Respiratory: CTA bilaterally, no wheezing, no rhonchi Abdominal: Soft, NT, ND, bowel sounds +, distended but not tender. 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  no cyanosis    The results of  significant diagnostics from this hospitalization (including imaging, microbiology, ancillary and laboratory) are listed below for reference.     Microbiology: Recent Results (from the past 240 hour(s))  SARS CORONAVIRUS 2 (TAT 6-24 HRS) Nasopharyngeal Nasopharyngeal Swab     Status: None   Collection Time: 05/05/19  3:38 PM   Specimen: Nasopharyngeal Swab  Result Value Ref Range Status   SARS Coronavirus 2 NEGATIVE NEGATIVE Final    Comment: (NOTE) SARS-CoV-2 target nucleic acids are NOT DETECTED. The SARS-CoV-2 RNA is generally detectable in upper and lower respiratory specimens during the acute phase of infection. Negative results do not preclude SARS-CoV-2 infection, do not rule out co-infections with other pathogens, and should not be used as the sole basis for treatment or other patient management decisions. Negative results must be combined with clinical observations, patient history, and epidemiological information. The expected result is Negative. Fact Sheet for Patients: HairSlick.nohttps://www.fda.gov/media/138098/download Fact Sheet for Healthcare Providers: quierodirigir.comhttps://www.fda.gov/media/138095/download This test is not yet approved or cleared by the [place] FDA and  has been authorized for detection and/or diagnosis of SARS-CoV-2 by FDA under an Emergency Use Authorization (EUA). This EUA will remain  in effect (meaning this test can be used) for the duration of the COVID-19 declaration under Section 56 4(b)(1) of the Act, 21 U.S.C. section 360bbb-3(b)(1), unless the authorization is terminated or revoked sooner. Performed at [facility], [address] [place], [place] [address]   Blood Culture (routine x 2)     Status: None (Preliminary result)   Collection Time: 05/05/19  4:33 PM   Specimen: BLOOD LEFT ARM  Result Value Ref Range Status   Specimen Description BLOOD LEFT ARM  Final   Special Requests   Final    BOTTLES DRAWN AEROBIC AND ANAEROBIC Blood Culture  adequate volume   Culture   Final    NO GROWTH 4 DAYS Performed at [facility], [address] [place], [place] [address]    Report Status PENDING  Incomplete  Blood Culture (routine x 2)     Status: None (Preliminary result)   Collection Time: 05/05/19  4:39 PM   Specimen: BLOOD LEFT WRIST  Result Value Ref Range Status   Specimen Description BLOOD LEFT WRIST  Final   Special Requests   Final    BOTTLES DRAWN AEROBIC AND ANAEROBIC Blood Culture adequate volume   Culture   Final    NO GROWTH 4 DAYS Performed at [facility], [address] [place], [place] [address]    Report Status PENDING  Incomplete  Culture, body fluid-bottle     Status: None (Preliminary result)   Collection Time: 05/06/19  4:07 PM   Specimen: Peritoneal Washings  Result Value Ref Range Status   Specimen Description PERITONEAL  Final   Special Requests NONE  Final   Culture   Final    NO GROWTH 3 DAYS Performed at [facility], [address] [place], [place] [address]    Report Status PENDING  Incomplete  Gram  stain     Status: None   Collection Time: 05/06/19  4:07 PM   Specimen: Peritoneal Washings  Result Value Ref Range Status   Specimen Description PERITONEAL  Final   Special Requests NONE  Final   Gram Stain   Final    NO WBC SEEN NO ORGANISMS SEEN Performed at [facility], [address] [place], [place] [address]    Report Status 05/07/2019 FINAL  Final     Labs: BNP (last 3 results) No results for input(s): BNP in the last 8760 hours. Basic Metabolic Panel: Recent Labs  Lab 05/05/19 1327 05/06/19 0532 05/07/19 0202 05/08/19 0514 05/09/19 0351  NA 133* 132* 129* 130* 132*  K 3.7 4.0 3.9 3.5 3.4*  CL 98 99 96* 98 98  CO2 22 22 21* 23 24  GLUCOSE 118* 126* 110* 98 95  BUN 6 7 8 7 7   CREATININE 0.56* 0.61 0.76 0.69 0.63  CALCIUM 8.0* 7.8* 8.1* 8.0* 8.1*  MG  --   --  1.4*  --   --    Liver Function Tests: Recent Labs  Lab 05/05/19 1327 05/06/19 0532  05/07/19 0202 05/08/19 0514 05/09/19 0351  AST 219* 183* 129* 93* 79*  ALT 76* 64* 55* 43 36  ALKPHOS 407* 347* 291* 263* 239*  BILITOT 4.8* 6.6* 6.5* 6.2* 7.6*  PROT 7.3 6.6 5.9* 5.5* 5.5*  ALBUMIN 2.7* 2.5* 2.0* 1.9* 2.5*   Recent Labs  Lab 05/05/19 1327  LIPASE 37   Recent Labs  Lab 05/05/19 1328  AMMONIA 59*   CBC: Recent Labs  Lab 05/05/19 1327 05/06/19 0532 05/07/19 0202 05/09/19 0351  WBC 12.3* 14.3* 13.0* 11.3*  NEUTROABS 9.2*  --   --  9.4*  HGB 13.3 11.4* 11.1* 10.3*  HCT 37.3* 32.8* 30.7* 29.2*  MCV 111.3* 110.8* 110.8* 112.7*  PLT 223 213 185 209   Cardiac Enzymes: No results for input(s): CKTOTAL, CKMB, CKMBINDEX, TROPONINI in the last 168 hours. BNP: Invalid input(s): POCBNP CBG: Recent Labs  Lab 05/06/19 2120  GLUCAP 119*   D-Dimer No results for input(s): DDIMER in the last 72 hours. Hgb A1c No results for input(s): HGBA1C in the last 72 hours. Lipid Profile No results for input(s): CHOL, HDL, LDLCALC, TRIG, CHOLHDL, LDLDIRECT in the last 72 hours. Thyroid function studies No results for input(s): TSH, T4TOTAL, T3FREE, THYROIDAB in the last 72 hours.  Invalid input(s): FREET3 Anemia work up No results for input(s): VITAMINB12, FOLATE, FERRITIN, TIBC, IRON, RETICCTPCT in the last 72 hours. Urinalysis    Component Value Date/Time   COLORURINE AMBER (A) 05/05/2019 1650   APPEARANCEUR CLEAR 05/05/2019 1650   LABSPEC >1.046 (H) 05/05/2019 1650   PHURINE 6.0 05/05/2019 1650   GLUCOSEU NEGATIVE 05/05/2019 1650   HGBUR NEGATIVE 05/05/2019 1650   BILIRUBINUR SMALL (A) 05/05/2019 1650   KETONESUR NEGATIVE 05/05/2019 1650   PROTEINUR NEGATIVE 05/05/2019 1650   NITRITE NEGATIVE 05/05/2019 1650   LEUKOCYTESUR NEGATIVE 05/05/2019 1650   Sepsis Labs Invalid input(s): PROCALCITONIN,  WBC,  LACTICIDVEN Microbiology Recent Results (from the past 240 hour(s))  SARS CORONAVIRUS 2 (TAT 6-24 HRS) Nasopharyngeal Nasopharyngeal Swab     Status: None    Collection Time: 05/05/19  3:38 PM   Specimen: Nasopharyngeal Swab  Result Value Ref Range Status   SARS Coronavirus 2 NEGATIVE NEGATIVE Final    Comment: (NOTE) SARS-CoV-2 target nucleic acids are NOT DETECTED. The SARS-CoV-2 RNA is generally detectable in upper and lower respiratory specimens during the  acute phase of infection. Negative results do not preclude SARS-CoV-2 infection, do not rule out co-infections with other pathogens, and should not be used as the sole basis for treatment or other patient management decisions. Negative results must be combined with clinical observations, patient history, and epidemiological information. The expected result is Negative. Fact Sheet for Patients: SugarRoll.be Fact Sheet for Healthcare Providers: https://www.woods-mathews.com/ This test is not yet approved or cleared by the [place] FDA and  has been authorized for detection and/or diagnosis of SARS-CoV-2 by FDA under an Emergency Use Authorization (EUA). This EUA will remain  in effect (meaning this test can be used) for the duration of the COVID-19 declaration under Section 56 4(b)(1) of the Act, 21 U.S.C. section 360bbb-3(b)(1), unless the authorization is terminated or revoked sooner. Performed at [facility], [place] [address] [place], [place] [address]   Blood Culture (routine x 2)     Status: None (Preliminary result)   Collection Time: 05/05/19  4:33 PM   Specimen: BLOOD LEFT ARM  Result Value Ref Range Status   Specimen Description BLOOD LEFT ARM  Final   Special Requests   Final    BOTTLES DRAWN AEROBIC AND ANAEROBIC Blood Culture adequate volume   Culture   Final    NO GROWTH 4 DAYS Performed at [facility], [address], [place], [place] [address]    Report Status PENDING  Incomplete  Blood Culture (routine x 2)     Status: None (Preliminary result)   Collection Time: 05/05/19  4:39 PM   Specimen: BLOOD LEFT  WRIST  Result Value Ref Range Status   Specimen Description BLOOD LEFT WRIST  Final   Special Requests   Final    BOTTLES DRAWN AEROBIC AND ANAEROBIC Blood Culture adequate volume   Culture   Final    NO GROWTH 4 DAYS Performed at [facility], [address], [place], [place] [address]    Report Status PENDING  Incomplete  Culture, body fluid-bottle     Status: None (Preliminary result)   Collection Time: 05/06/19  4:07 PM   Specimen: Peritoneal Washings  Result Value Ref Range Status   Specimen Description PERITONEAL  Final   Special Requests NONE  Final   Culture   Final    NO GROWTH 3 DAYS Performed at [facility], [place] [address] [place], [place] [address]    Report Status PENDING  Incomplete  Gram stain     Status: None   Collection Time: 05/06/19  4:07 PM   Specimen: Peritoneal Washings  Result Value Ref Range Status   Specimen Description PERITONEAL  Final   Special Requests NONE  Final   Gram Stain   Final    NO WBC SEEN NO ORGANISMS SEEN Performed at [facility], [address], [place], [place] [address]    Report Status 05/07/2019 FINAL  Final     Time coordinating discharge:  45 minutes  SIGNED:   Barb Merino, MD  [facility] 05/09/2019, 10:09 AM

## 2019-05-09 NOTE — Progress Notes (Signed)
[facility] Gastroenterology Progress Note  Henry [name] 58 y.o. [birth year]  CC: Alcoholic hepatitis, ascites   Subjective: Feeling better today.  Had another paracentesis yesterday.  Denies any acute GI issues.  ROS : Negative for acute chest pain and shortness of breath.   Objective: Vital signs in last 24 hours: Vitals:   05/08/19 2120 05/09/19 0634  BP: 109/83 109/82  Pulse: 93 (!) 102  Resp: 19 19  Temp: 98.4 F (36.9 C) 99.3 F (37.4 C)  SpO2: 99% 96%    Physical Exam:  General:  Alert, cooperative, no distress, appears stated age  Head:  Normocephalic, without obvious abnormality, atraumatic  Eyes:   Mild scleral icterus noted  Lungs:   Clear to auscultation bilaterally, respirations unlabored, anterior exam only  Heart:  Regular rate and rhythm, S1, S2 normal  Abdomen:    Abdomen remains distended, bowel sounds present, no peritoneal signs  Extremities:  Right lower extremity edema noted.  Left BKA with prosthesis noted.       Lab Results: Recent Labs    05/07/19 0202 05/08/19 0514 05/09/19 0351  NA 129* 130* 132*  K 3.9 3.5 3.4*  CL 96* 98 98  CO2 21* 23 24  GLUCOSE 110* 98 95  BUN 8 7 7   CREATININE 0.76 0.69 0.63  CALCIUM 8.1* 8.0* 8.1*  MG 1.4*  --   --    Recent Labs    05/08/19 0514 05/09/19 0351  AST 93* 79*  ALT 43 36  ALKPHOS 263* 239*  BILITOT 6.2* 7.6*  PROT 5.5* 5.5*  ALBUMIN 1.9* 2.5*   Recent Labs    05/07/19 0202 05/09/19 0351  WBC 13.0* 11.3*  NEUTROABS  --  9.4*  HGB 11.1* 10.3*  HCT 30.7* 29.2*  MCV 110.8* 112.7*  PLT 185 209   Recent Labs    05/07/19 0202 05/08/19 0514  LABPROT 21.3* 19.8*  INR 1.9* 1.7*      Assessment/Plan: -Ascites with lower extremity edema.  Could be from underlying cirrhosis from alcohol use.  Underwent paracentesis yesterday with removal of 1.5 L of fluid. negative for SBP. -Abnormal LFTs.  Most likely from underlying alcohol use,/alcoholic hepatitis.  Hepatitis panel negative -Atrial  flutter with RVR.  Converted to sinus rhythm. -Intermittent rectal bleeding per patient.  Occult blood was negative on admission.  Recommendations ----------------------- -Unfortunately, large-volume paracentesis was not performed and only 1.4 L of fluid was removed yesterday.  Patient's abdomen remains distended but he is feeling better.  -Continue Lasix 40 mg and spironolactone 50 mg for now. -Check vitamin B12 and folate level -Absolute alcohol abstinence advised.  Recommend 2 g sodium diet. -Recommend outpatient EGD and colonoscopy.  -Possibility of underlying cirrhosis discussed with the patient.  He verbalized understanding.  -No further inpatient GI work-up planned.  Follow-up in GI clinic in 4 weeks.  GI will sign off   Otis Brace MD, FACP 05/09/2019, 9:52 AM  Contact #  [phone]

## 2019-05-10 LAB — CULTURE, BLOOD (ROUTINE X 2)
Culture: NO GROWTH
Culture: NO GROWTH
Special Requests: ADEQUATE
Special Requests: ADEQUATE

## 2019-05-11 LAB — CULTURE, BODY FLUID W GRAM STAIN -BOTTLE: Culture: NO GROWTH

## 2019-06-23 ENCOUNTER — Other Ambulatory Visit: Payer: Self-pay | Admitting: Gastroenterology

## 2019-06-23 ENCOUNTER — Other Ambulatory Visit (HOSPITAL_COMMUNITY): Payer: Self-pay | Admitting: Gastroenterology

## 2019-06-23 DIAGNOSIS — K7031 Alcoholic cirrhosis of liver with ascites: Secondary | ICD-10-CM

## 2019-07-03 ENCOUNTER — Ambulatory Visit (HOSPITAL_COMMUNITY)
Admission: RE | Admit: 2019-07-03 | Discharge: 2019-07-03 | Disposition: A | Discharge status: Home / Self Care | Payer: Medicaid Other | Source: Ambulatory Visit | Attending: Gastroenterology | Admitting: Gastroenterology

## 2019-07-03 ENCOUNTER — Other Ambulatory Visit: Payer: Self-pay

## 2019-07-03 ENCOUNTER — Encounter (HOSPITAL_COMMUNITY): Payer: Self-pay | Admitting: Physician Assistant

## 2019-07-03 DIAGNOSIS — K7031 Alcoholic cirrhosis of liver with ascites: Secondary | ICD-10-CM

## 2019-07-03 HISTORY — PX: IR PARACENTESIS: IMG2679

## 2019-07-03 MED ORDER — LIDOCAINE HCL 1 % IJ SOLN
INTRAMUSCULAR | Status: AC
  Filled 2019-07-03: qty 20

## 2019-07-03 MED ORDER — LIDOCAINE HCL 1 % IJ SOLN
INTRAMUSCULAR | Status: DC | PRN
  Administered 2019-07-03: 10 mL

## 2019-07-03 NOTE — Procedures (Signed)
Ultrasound-guided diagnostic and therapeutic paracentesis performed yielding 7.2 liters of straw colored fluid.  No immediate complications. EBL is none.

## 2019-12-09 ENCOUNTER — Inpatient Hospital Stay (HOSPITAL_COMMUNITY)
Admission: EM | Admit: 2019-12-09 | Discharge: 2019-12-12 | DRG: 310 | Disposition: A | Discharge status: Home / Self Care | Payer: Medicaid Other | Attending: Internal Medicine | Admitting: Internal Medicine

## 2019-12-09 ENCOUNTER — Encounter (HOSPITAL_COMMUNITY): Payer: Self-pay | Admitting: Emergency Medicine

## 2019-12-09 ENCOUNTER — Other Ambulatory Visit: Payer: Self-pay

## 2019-12-09 DIAGNOSIS — F1290 Cannabis use, unspecified, uncomplicated: Secondary | ICD-10-CM | POA: Diagnosis present

## 2019-12-09 DIAGNOSIS — I4892 Unspecified atrial flutter: Secondary | ICD-10-CM | POA: Diagnosis present

## 2019-12-09 DIAGNOSIS — Z7982 Long term (current) use of aspirin: Secondary | ICD-10-CM

## 2019-12-09 DIAGNOSIS — Z20822 Contact with and (suspected) exposure to covid-19: Secondary | ICD-10-CM | POA: Diagnosis present

## 2019-12-09 DIAGNOSIS — Z7141 Alcohol abuse counseling and surveillance of alcoholic: Secondary | ICD-10-CM

## 2019-12-09 DIAGNOSIS — F17210 Nicotine dependence, cigarettes, uncomplicated: Secondary | ICD-10-CM | POA: Diagnosis present

## 2019-12-09 DIAGNOSIS — Z79899 Other long term (current) drug therapy: Secondary | ICD-10-CM

## 2019-12-09 DIAGNOSIS — I700 Atherosclerosis of aorta: Secondary | ICD-10-CM | POA: Diagnosis present

## 2019-12-09 DIAGNOSIS — K7031 Alcoholic cirrhosis of liver with ascites: Secondary | ICD-10-CM | POA: Diagnosis present

## 2019-12-09 DIAGNOSIS — Z9114 Patient's other noncompliance with medication regimen: Secondary | ICD-10-CM

## 2019-12-09 DIAGNOSIS — F10929 Alcohol use, unspecified with intoxication, unspecified: Secondary | ICD-10-CM

## 2019-12-09 DIAGNOSIS — Z716 Tobacco abuse counseling: Secondary | ICD-10-CM

## 2019-12-09 DIAGNOSIS — R0602 Shortness of breath: Secondary | ICD-10-CM | POA: Diagnosis not present

## 2019-12-09 DIAGNOSIS — Z7289 Other problems related to lifestyle: Secondary | ICD-10-CM

## 2019-12-09 DIAGNOSIS — F10129 Alcohol abuse with intoxication, unspecified: Secondary | ICD-10-CM | POA: Diagnosis present

## 2019-12-09 DIAGNOSIS — Z89612 Acquired absence of left leg above knee: Secondary | ICD-10-CM

## 2019-12-09 DIAGNOSIS — Y908 Blood alcohol level of 240 mg/100 ml or more: Secondary | ICD-10-CM | POA: Diagnosis present

## 2019-12-09 DIAGNOSIS — K7030 Alcoholic cirrhosis of liver without ascites: Secondary | ICD-10-CM | POA: Diagnosis present

## 2019-12-09 DIAGNOSIS — I4891 Unspecified atrial fibrillation: Secondary | ICD-10-CM

## 2019-12-09 DIAGNOSIS — Z823 Family history of stroke: Secondary | ICD-10-CM

## 2019-12-09 DIAGNOSIS — Z720 Tobacco use: Secondary | ICD-10-CM | POA: Diagnosis present

## 2019-12-09 HISTORY — DX: Unspecified cirrhosis of liver: K74.60

## 2019-12-09 LAB — CBC WITH DIFFERENTIAL/PLATELET
Abs Immature Granulocytes: 0.02 10*3/uL (ref 0.00–0.07)
Basophils Absolute: 0.1 10*3/uL (ref 0.0–0.1)
Basophils Relative: 1 %
Eosinophils Absolute: 0.1 10*3/uL (ref 0.0–0.5)
Eosinophils Relative: 2 %
HCT: 43 % (ref 39.0–52.0)
Hemoglobin: 15.4 g/dL (ref 13.0–17.0)
Immature Granulocytes: 0 %
Lymphocytes Relative: 42 %
Lymphs Abs: 3.4 10*3/uL (ref 0.7–4.0)
MCH: 35.7 pg — ABNORMAL HIGH (ref 26.0–34.0)
MCHC: 35.8 g/dL (ref 30.0–36.0)
MCV: 99.8 fL (ref 80.0–100.0)
Monocytes Absolute: 0.8 10*3/uL (ref 0.1–1.0)
Monocytes Relative: 10 %
Neutro Abs: 3.6 10*3/uL (ref 1.7–7.7)
Neutrophils Relative %: 45 %
Platelets: 219 10*3/uL (ref 150–400)
RBC: 4.31 MIL/uL (ref 4.22–5.81)
RDW: 13.6 % (ref 11.5–15.5)
WBC: 8.1 10*3/uL (ref 4.0–10.5)
nRBC: 0 % (ref 0.0–0.2)

## 2019-12-09 LAB — RESPIRATORY PANEL BY RT PCR (FLU A&B, COVID)
Influenza A by PCR: NEGATIVE
Influenza B by PCR: NEGATIVE
SARS Coronavirus 2 by RT PCR: NEGATIVE

## 2019-12-09 LAB — COMPREHENSIVE METABOLIC PANEL
ALT: 87 U/L — ABNORMAL HIGH (ref 0–44)
AST: 141 U/L — ABNORMAL HIGH (ref 15–41)
Albumin: 3.9 g/dL (ref 3.5–5.0)
Alkaline Phosphatase: 157 U/L — ABNORMAL HIGH (ref 38–126)
Anion gap: 14 (ref 5–15)
BUN: 11 mg/dL (ref 6–20)
CO2: 20 mmol/L — ABNORMAL LOW (ref 22–32)
Calcium: 9.2 mg/dL (ref 8.9–10.3)
Chloride: 107 mmol/L (ref 98–111)
Creatinine, Ser: 0.74 mg/dL (ref 0.61–1.24)
GFR calc Af Amer: >60 mL/min (ref >60)
GFR calc non Af Amer: >60 mL/min (ref >60)
Glucose, Bld: 107 mg/dL — ABNORMAL HIGH (ref 70–99)
Potassium: 3.6 mmol/L (ref 3.5–5.1)
Sodium: 141 mmol/L (ref 135–145)
Total Bilirubin: 0.9 mg/dL (ref 0.3–1.2)
Total Protein: 8 g/dL (ref 6.5–8.1)

## 2019-12-09 LAB — ETHANOL: Alcohol, Ethyl (B): 323 mg/dL (ref <10)

## 2019-12-09 LAB — LIPASE, BLOOD: Lipase: 33 U/L (ref 11–51)

## 2019-12-09 MED ORDER — DILTIAZEM HCL-DEXTROSE 125-5 MG/125ML-% IV SOLN (PREMIX)
5.0000 mg/h | INTRAVENOUS | Status: DC
  Administered 2019-12-09: 23:00:00 5 mg/h via INTRAVENOUS
  Administered 2019-12-10: 15 mg/h via INTRAVENOUS
  Administered 2019-12-10: 12.5 mg/h via INTRAVENOUS
  Administered 2019-12-11: 03:00:00 10 mg/h via INTRAVENOUS
  Filled 2019-12-09 (×4): qty 125

## 2019-12-09 MED ORDER — SODIUM CHLORIDE 0.9 % IV SOLN: INTRAVENOUS | Status: DC

## 2019-12-09 MED ORDER — DILTIAZEM LOAD VIA INFUSION
20.0000 mg | Freq: Once | INTRAVENOUS | Status: AC
  Administered 2019-12-09: 20 mg via INTRAVENOUS
  Filled 2019-12-09: qty 20

## 2019-12-09 NOTE — ED Triage Notes (Signed)
Pt with abdominal x 4 quadrants. C/o bloating and no urination x 5days. Pt with cirrhosis.
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
## 2019-12-09 NOTE — ED Notes (Signed)
Date and time results received: 12/09/19 2335   Test: ETOH Critical Value: 323  Name of Provider Notified: Lynelle Doctor, MD  Orders Received? Or Actions Taken?: acknowledged

## 2019-12-10 ENCOUNTER — Inpatient Hospital Stay (HOSPITAL_COMMUNITY): Payer: Medicaid Other

## 2019-12-10 ENCOUNTER — Emergency Department (HOSPITAL_COMMUNITY): Payer: Medicaid Other

## 2019-12-10 DIAGNOSIS — Z89612 Acquired absence of left leg above knee: Secondary | ICD-10-CM | POA: Diagnosis not present

## 2019-12-10 DIAGNOSIS — Z79899 Other long term (current) drug therapy: Secondary | ICD-10-CM | POA: Diagnosis not present

## 2019-12-10 DIAGNOSIS — K7030 Alcoholic cirrhosis of liver without ascites: Secondary | ICD-10-CM | POA: Diagnosis present

## 2019-12-10 DIAGNOSIS — I4891 Unspecified atrial fibrillation: Secondary | ICD-10-CM

## 2019-12-10 DIAGNOSIS — I700 Atherosclerosis of aorta: Secondary | ICD-10-CM | POA: Diagnosis present

## 2019-12-10 DIAGNOSIS — F1290 Cannabis use, unspecified, uncomplicated: Secondary | ICD-10-CM | POA: Diagnosis present

## 2019-12-10 DIAGNOSIS — F17210 Nicotine dependence, cigarettes, uncomplicated: Secondary | ICD-10-CM | POA: Diagnosis present

## 2019-12-10 DIAGNOSIS — Z9114 Patient's other noncompliance with medication regimen: Secondary | ICD-10-CM | POA: Diagnosis not present

## 2019-12-10 DIAGNOSIS — Z7141 Alcohol abuse counseling and surveillance of alcoholic: Secondary | ICD-10-CM | POA: Diagnosis not present

## 2019-12-10 DIAGNOSIS — Z823 Family history of stroke: Secondary | ICD-10-CM | POA: Diagnosis not present

## 2019-12-10 DIAGNOSIS — Z716 Tobacco abuse counseling: Secondary | ICD-10-CM | POA: Diagnosis not present

## 2019-12-10 DIAGNOSIS — R0602 Shortness of breath: Secondary | ICD-10-CM | POA: Diagnosis not present

## 2019-12-10 DIAGNOSIS — F10129 Alcohol abuse with intoxication, unspecified: Secondary | ICD-10-CM | POA: Diagnosis present

## 2019-12-10 DIAGNOSIS — Y908 Blood alcohol level of 240 mg/100 ml or more: Secondary | ICD-10-CM | POA: Diagnosis present

## 2019-12-10 DIAGNOSIS — Z20822 Contact with and (suspected) exposure to covid-19: Secondary | ICD-10-CM | POA: Diagnosis present

## 2019-12-10 DIAGNOSIS — Z7982 Long term (current) use of aspirin: Secondary | ICD-10-CM | POA: Diagnosis not present

## 2019-12-10 DIAGNOSIS — Z7289 Other problems related to lifestyle: Secondary | ICD-10-CM | POA: Diagnosis not present

## 2019-12-10 LAB — BASIC METABOLIC PANEL
Anion gap: 12 (ref 5–15)
BUN: 10 mg/dL (ref 6–20)
CO2: 20 mmol/L — ABNORMAL LOW (ref 22–32)
Calcium: 8.7 mg/dL — ABNORMAL LOW (ref 8.9–10.3)
Chloride: 104 mmol/L (ref 98–111)
Creatinine, Ser: 0.72 mg/dL (ref 0.61–1.24)
GFR calc Af Amer: >60 mL/min (ref >60)
GFR calc non Af Amer: >60 mL/min (ref >60)
Glucose, Bld: 103 mg/dL — ABNORMAL HIGH (ref 70–99)
Potassium: 3.5 mmol/L (ref 3.5–5.1)
Sodium: 136 mmol/L (ref 135–145)

## 2019-12-10 LAB — RAPID URINE DRUG SCREEN, HOSP PERFORMED
Amphetamines: NOT DETECTED
Barbiturates: NOT DETECTED
Benzodiazepines: NOT DETECTED
Cocaine: NOT DETECTED
Opiates: NOT DETECTED
Tetrahydrocannabinol: POSITIVE — AB

## 2019-12-10 LAB — ECHOCARDIOGRAM COMPLETE
Height: 68 in
Weight: 2800 oz

## 2019-12-10 LAB — URINALYSIS, ROUTINE W REFLEX MICROSCOPIC
Bacteria, UA: NONE SEEN
Bilirubin Urine: NEGATIVE
Glucose, UA: NEGATIVE mg/dL
Ketones, ur: 5 mg/dL — AB
Leukocytes,Ua: NEGATIVE
Nitrite: NEGATIVE
Protein, ur: NEGATIVE mg/dL
Specific Gravity, Urine: 1.013 (ref 1.005–1.030)
pH: 5 (ref 5.0–8.0)

## 2019-12-10 LAB — CBC
HCT: 41.4 % (ref 39.0–52.0)
Hemoglobin: 14.5 g/dL (ref 13.0–17.0)
MCH: 35.7 pg — ABNORMAL HIGH (ref 26.0–34.0)
MCHC: 35 g/dL (ref 30.0–36.0)
MCV: 102 fL — ABNORMAL HIGH (ref 80.0–100.0)
Platelets: 191 10*3/uL (ref 150–400)
RBC: 4.06 MIL/uL — ABNORMAL LOW (ref 4.22–5.81)
RDW: 13.8 % (ref 11.5–15.5)
WBC: 6.7 10*3/uL (ref 4.0–10.5)
nRBC: 0 % (ref 0.0–0.2)

## 2019-12-10 LAB — TROPONIN I (HIGH SENSITIVITY)
Troponin I (High Sensitivity): 17 ng/L (ref <18)
Troponin I (High Sensitivity): 15 ng/L (ref <18)
Troponin I (High Sensitivity): 11 ng/L (ref <18)

## 2019-12-10 LAB — AMMONIA: Ammonia: 35 umol/L (ref 9–35)

## 2019-12-10 LAB — PROTIME-INR
INR: 1.1 (ref 0.8–1.2)
Prothrombin Time: 14.2 seconds (ref 11.4–15.2)

## 2019-12-10 LAB — TSH: TSH: 4.249 u[IU]/mL (ref 0.350–4.500)

## 2019-12-10 LAB — CBG MONITORING, ED: Glucose-Capillary: 152 mg/dL — ABNORMAL HIGH (ref 70–99)

## 2019-12-10 LAB — MRSA PCR SCREENING: MRSA by PCR: NEGATIVE

## 2019-12-10 MED ORDER — POTASSIUM CHLORIDE CRYS ER 20 MEQ PO TBCR
40.0000 meq | EXTENDED_RELEASE_TABLET | Freq: Once | ORAL | Status: AC
  Administered 2019-12-10: 40 meq via ORAL
  Filled 2019-12-10: qty 2

## 2019-12-10 MED ORDER — FOLIC ACID 1 MG PO TABS
1.0000 mg | ORAL_TABLET | Freq: Every day | ORAL | Status: DC
  Administered 2019-12-10 – 2019-12-12 (×4): 1 mg via ORAL
  Filled 2019-12-10 (×3): qty 1

## 2019-12-10 MED ORDER — ENOXAPARIN SODIUM 40 MG/0.4ML ~~LOC~~ SOLN
40.0000 mg | SUBCUTANEOUS | Status: DC
  Administered 2019-12-10 – 2019-12-12 (×3): 40 mg via SUBCUTANEOUS
  Filled 2019-12-10 (×3): qty 0.4

## 2019-12-10 MED ORDER — BISACODYL 10 MG RE SUPP: 10.0000 mg | Freq: Every day | RECTAL | Status: DC | PRN

## 2019-12-10 MED ORDER — SODIUM CHLORIDE 0.9 % IV SOLN: 250.0000 mL | INTRAVENOUS | Status: DC | PRN

## 2019-12-10 MED ORDER — LACTULOSE 10 GM/15ML PO SOLN
20.0000 g | Freq: Two times a day (BID) | ORAL | Status: DC
  Administered 2019-12-10 – 2019-12-12 (×6): 20 g via ORAL
  Filled 2019-12-10 (×5): qty 30

## 2019-12-10 MED ORDER — THIAMINE HCL 100 MG PO TABS
100.0000 mg | ORAL_TABLET | Freq: Every day | ORAL | Status: DC
  Administered 2019-12-11 – 2019-12-12 (×2): 100 mg via ORAL
  Filled 2019-12-10 (×2): qty 1

## 2019-12-10 MED ORDER — CHLORHEXIDINE GLUCONATE CLOTH 2 % EX PADS
6.0000 | MEDICATED_PAD | Freq: Every day | CUTANEOUS | Status: DC
  Administered 2019-12-10 – 2019-12-11 (×2): 6 via TOPICAL

## 2019-12-10 MED ORDER — SODIUM CHLORIDE 0.9% FLUSH: 3.0000 mL | INTRAVENOUS | Status: DC | PRN

## 2019-12-10 MED ORDER — ONDANSETRON HCL 4 MG PO TABS: 4.0000 mg | ORAL_TABLET | Freq: Four times a day (QID) | ORAL | Status: DC | PRN

## 2019-12-10 MED ORDER — LABETALOL HCL 5 MG/ML IV SOLN
10.0000 mg | INTRAVENOUS | Status: DC | PRN
  Administered 2019-12-10: 10 mg via INTRAVENOUS
  Filled 2019-12-10: qty 4

## 2019-12-10 MED ORDER — ADULT MULTIVITAMIN W/MINERALS CH
1.0000 | ORAL_TABLET | Freq: Every day | ORAL | Status: DC
  Administered 2019-12-10 – 2019-12-12 (×4): 1 via ORAL
  Filled 2019-12-10 (×3): qty 1

## 2019-12-10 MED ORDER — POLYETHYLENE GLYCOL 3350 17 G PO PACK: 17.0000 g | PACK | Freq: Every day | ORAL | Status: DC | PRN

## 2019-12-10 MED ORDER — ASPIRIN EC 81 MG PO TBEC
81.0000 mg | DELAYED_RELEASE_TABLET | Freq: Every day | ORAL | Status: DC
  Administered 2019-12-10 – 2019-12-12 (×4): 81 mg via ORAL
  Filled 2019-12-10 (×3): qty 1

## 2019-12-10 MED ORDER — ONDANSETRON HCL 4 MG/2ML IJ SOLN: 4.0000 mg | Freq: Four times a day (QID) | INTRAMUSCULAR | Status: DC | PRN

## 2019-12-10 MED ORDER — LORAZEPAM 1 MG PO TABS: 1.0000 mg | ORAL_TABLET | ORAL | Status: DC | PRN

## 2019-12-10 MED ORDER — METOPROLOL TARTRATE 5 MG/5ML IV SOLN
5.0000 mg | Freq: Four times a day (QID) | INTRAVENOUS | Status: DC | PRN
  Administered 2019-12-10 (×2): 5 mg via INTRAVENOUS
  Filled 2019-12-10 (×2): qty 5

## 2019-12-10 MED ORDER — NICOTINE 21 MG/24HR TD PT24
21.0000 mg | MEDICATED_PATCH | Freq: Every day | TRANSDERMAL | Status: DC
  Administered 2019-12-10 – 2019-12-12 (×5): 21 mg via TRANSDERMAL
  Filled 2019-12-10 (×4): qty 1

## 2019-12-10 MED ORDER — IOHEXOL 300 MG/ML  SOLN
100.0000 mL | Freq: Once | INTRAMUSCULAR | Status: AC | PRN
  Administered 2019-12-10: 100 mL via INTRAVENOUS

## 2019-12-10 MED ORDER — THIAMINE HCL 100 MG/ML IJ SOLN
100.0000 mg | Freq: Every day | INTRAMUSCULAR | Status: DC
  Administered 2019-12-10 (×2): 100 mg via INTRAVENOUS
  Filled 2019-12-10: qty 2

## 2019-12-10 MED ORDER — LORAZEPAM 2 MG/ML IJ SOLN
1.0000 mg | INTRAMUSCULAR | Status: DC | PRN
  Administered 2019-12-10: 2 mg via INTRAVENOUS
  Administered 2019-12-10: 1 mg via INTRAVENOUS
  Administered 2019-12-12: 2 mg via INTRAVENOUS
  Filled 2019-12-10 (×3): qty 1

## 2019-12-10 MED ORDER — SODIUM CHLORIDE 0.9% FLUSH
3.0000 mL | Freq: Two times a day (BID) | INTRAVENOUS | Status: DC
  Administered 2019-12-10 – 2019-12-12 (×5): 3 mL via INTRAVENOUS

## 2019-12-10 NOTE — Progress Notes (Addendum)
Per HPI: Henry [name] is a 59 y.o. male with medical history significant of atrial flutter, alcohol abuse who presented to the ER with abdominal pain.  Patient has a history of alcoholic liver cirrhosis and alcohol abuse as well as prior history of atrial flutter.  Has occasionally been using alcohol.  Did consume alcohol today.  Alcohol level was 323 on presentation.  Started having abdominal pain, bloating, right upper quadrant discomfort and difficulty with urination over the last 4 to 5 days.  He also reports feeling increasingly weak. He has had intermittent palpitations for the last several months. No chest pain, fever, chills, cough, diarrhea, dysuria.  4/25: Patient seen and evaluated at bedside this a.m.  He is noted to have ongoing mild shortness of breath and has been started on Cardizem IV drip with heart rates noted to be 120-130 bpm.  Prior 2D echocardiogram on 04/2019 with LVEF 60-65% and repeat 2D echocardiogram pending.  TSH within normal limits at 4.249.  Continue on CIWA protocol and monitor repeat labs in a.m.  Metoprolol IV pushes ordered every 6 hours as needed to assist with weaning of Cardizem drip.  We will plan to resume home oral Cardizem once heart rates are more stable.  Anticipate discharge after discussion with cardiology in the next 24 hours if no significant 2D echocardiogram findings and if stable on home Cardizem. Chads score of 0. Plan to keep on aspirin as prescribed.  Total care time: 30 minutes.

## 2019-12-10 NOTE — ED Provider Notes (Signed)
Patient signed out to me by Dr. Lynelle Doctor.  Patient presented with abdominal discomfort.  He reports that he has been having pain in his abdomen since last September.  Over the last 5 days it has worsened.  He feels like he cannot pass his urine.  Patient noted to be intoxicated at arrival.  He does have a history of cirrhosis.  Bladder scan did not show any significant urinary retention, he ultimately was able to urinate on his own.  Urinalysis does not suggest infection.  CT abdomen and pelvis does not show any significant ascites or other pathology.  Patient will be hospitalized for further management of his atrial fibrillation with rapid ventricular response.  This patients CHA2DS2-VASc Score and unadjusted Ischemic Stroke Rate (% per year) is equal to 0.2 % stroke rate/year from a score of 0  Above score calculated as 1 point each if present [CHF, HTN, DM, Vascular=MI/PAD/Aortic Plaque, Age if 65-74, or Male] Above score calculated as 2 points each if present [Age > 75, or Stroke/TIA/TE]    Gilda Crease, MD 12/10/19 [phone]

## 2019-12-10 NOTE — Progress Notes (Signed)
  Echocardiogram 2D Echocardiogram has been performed.  Leta Jungling M 12/10/2019, 12:04 PM

## 2019-12-10 NOTE — ED Notes (Signed)
Meal given
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
## 2019-12-10 NOTE — Progress Notes (Signed)
Sherryll Burger DO notified of patient's elevated BP awaiting response/orders at this time.

## 2019-12-11 DIAGNOSIS — I4891 Unspecified atrial fibrillation: Principal | ICD-10-CM

## 2019-12-11 LAB — COMPREHENSIVE METABOLIC PANEL
ALT: 67 U/L — ABNORMAL HIGH (ref 0–44)
AST: 86 U/L — ABNORMAL HIGH (ref 15–41)
Albumin: 3.6 g/dL (ref 3.5–5.0)
Alkaline Phosphatase: 133 U/L — ABNORMAL HIGH (ref 38–126)
Anion gap: 10 (ref 5–15)
BUN: 6 mg/dL (ref 6–20)
CO2: 25 mmol/L (ref 22–32)
Calcium: 9 mg/dL (ref 8.9–10.3)
Chloride: 99 mmol/L (ref 98–111)
Creatinine, Ser: 0.6 mg/dL — ABNORMAL LOW (ref 0.61–1.24)
GFR calc Af Amer: >60 mL/min (ref >60)
GFR calc non Af Amer: >60 mL/min (ref >60)
Glucose, Bld: 117 mg/dL — ABNORMAL HIGH (ref 70–99)
Potassium: 3.1 mmol/L — ABNORMAL LOW (ref 3.5–5.1)
Sodium: 134 mmol/L — ABNORMAL LOW (ref 135–145)
Total Bilirubin: 2.5 mg/dL — ABNORMAL HIGH (ref 0.3–1.2)
Total Protein: 7.1 g/dL (ref 6.5–8.1)

## 2019-12-11 LAB — MAGNESIUM: Magnesium: 1.7 mg/dL (ref 1.7–2.4)

## 2019-12-11 MED ORDER — POTASSIUM CHLORIDE CRYS ER 20 MEQ PO TBCR
40.0000 meq | EXTENDED_RELEASE_TABLET | Freq: Two times a day (BID) | ORAL | Status: AC
  Administered 2019-12-11 (×2): 40 meq via ORAL
  Filled 2019-12-11 (×2): qty 2

## 2019-12-11 MED ORDER — DILTIAZEM HCL 60 MG PO TABS
90.0000 mg | ORAL_TABLET | Freq: Four times a day (QID) | ORAL | Status: DC
  Administered 2019-12-11 – 2019-12-12 (×5): 90 mg via ORAL
  Filled 2019-12-11 (×5): qty 1
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
## 2019-12-11 NOTE — Progress Notes (Signed)
PROGRESS NOTE    Henry [name]  MPN:[id number] DOB: [birth year] DOA: 12/09/2019 PCP: Patient, No Pcp Per   Brief Narrative:  Per HPI: Henry Dallas Wittyis a 59 y.o.malewith medical history significant ofatrial flutter, alcohol abuse who presented to the ER with abdominal pain. Patient has a history of alcoholic liver cirrhosis and alcohol abuse as well as prior history of atrial flutter. Has occasionally been using alcohol. Did consume alcohol today. Alcohol level was 323 on presentation. Started having abdominal pain, bloating, right upper quadrant discomfort and difficulty with urination over the last 4 to 5 days. He also reports feeling increasingly weak. He has had intermittent palpitations for the last several months. No chest pain, fever, chills, cough,diarrhea, dysuria.  4/25: Patient seen and evaluated at bedside this a.m.  He is noted to have ongoing mild shortness of breath and has been started on Cardizem IV drip with heart rates noted to be 120-130 bpm.  Prior 2D echocardiogram on 04/2019 with LVEF 60-65% and repeat 2D echocardiogram pending.  TSH within normal limits at 4.249.  Continue on CIWA protocol and monitor repeat labs in a.m.  Metoprolol IV pushes ordered every 6 hours as needed to assist with weaning of Cardizem drip.  We will plan to resume home oral Cardizem once heart rates are more stable.  Anticipate discharge after discussion with cardiology in the next 24 hours if no significant 2D echocardiogram findings and if stable on home Cardizem. Chads score of 0. Plan to keep on aspirin as prescribed.  4/26: Continues to remain on Cardizem drip with elevated heart rates.  Seen by cardiology and resumed on oral Cardizem.  Will attempt to wean today.  No significant findings on TSH or 2D echocardiogram.  Does not have any alcohol withdrawal symptoms currently.  Continue to monitor and wean Cardizem.  Assessment & Plan:   Principal Problem:   Atrial fibrillation with RVR  (HCC) Active Problems:   Atrial flutter (HCC)   Alcoholic cirrhosis of liver with ascites (HCC)   Tobacco abuse   Atrial fibrillation with RVR -Appreciate cardiology consultation, plan to start diltiazem 60 mg every 6 hours and wean diltiazem drip -He is noted to be a poor candidate for cardioversion -Continue aspirin 81 mg daily -Continue monitoring on CIWA scale -2D echocardiogram with EF 70-75%, hyperdynamic -TSH 4.249  History of ongoing alcohol abuse -Discussed alcohol cessation, but long history of abuse over 50 years -Continue CIWA protocol -Multivitamin, thiamine, folic acid  Nicotine dependence -Smokes 1-1/2 packs/day -Counseled on cessation, nicotine patch   DVT prophylaxis: Lovenox Code Status: Full Family Communication: None at bedside Disposition Plan: Continue heart rate control, appreciate cardiology consultation.  Anticipate discharge in next 24-48 hours once weaned off Cardizem drip and with stable control.   Consultants:   Cardiology  Procedures:   None  Antimicrobials:   None   Subjective: Patient seen and evaluated today with no new acute complaints or concerns. No acute concerns or events noted overnight.  He continues to remain on Cardizem drip with elevated heart rates.  Objective: Vitals:   12/11/19 0700 12/11/19 0800 12/11/19 0804 12/11/19 0911  BP: (!) 117/91 115/85    Pulse: (!) 106 (!) 105    Resp: 16 17    Temp:   98.2 F (36.8 C)   TempSrc:   Oral   SpO2: 94% 92%  96%  Weight:      Height:        Intake/Output Summary (Last 24 hours) at 12/11/2019 0915 Last  data filed at 12/11/2019 0809 Gross per 24 hour  Intake 1332.76 ml  Output 1050 ml  Net 282.76 ml   Filed Weights   12/09/19 2202 12/10/19 1225 12/11/19 0500  Weight: 79.4 kg 82.7 kg 82.1 kg    Examination:  General exam: Appears calm and comfortable  Respiratory system: Clear to auscultation. Respiratory effort normal. Cardiovascular system: Irregular and  tachycardic. No JVD, murmurs, rubs, gallops or clicks. No pedal edema. Gastrointestinal system: Abdomen is nondistended, soft and nontender. No organomegaly or masses felt. Normal bowel sounds heard. Central nervous system: Alert and oriented. No focal neurological deficits. Extremities: Symmetric 5 x 5 power. Skin: No rashes, lesions or ulcers Psychiatry: Judgement and insight appear normal. Mood & affect appropriate.     Data Reviewed: I have personally reviewed following labs and imaging studies  CBC: Recent Labs  Lab 12/09/19 2258 12/10/19 0343  WBC 8.1 6.7  NEUTROABS 3.6  --   HGB 15.4 14.5  HCT 43.0 41.4  MCV 99.8 102.0*  PLT 219 191   Basic Metabolic Panel: Recent Labs  Lab 12/09/19 2258 12/10/19 0343 12/11/19 0356  NA 141 136 134*  K 3.6 3.5 3.1*  CL 107 104 99  CO2 20* 20* 25  GLUCOSE 107* 103* 117*  BUN 11 10 6   CREATININE 0.74 0.72 0.60*  CALCIUM 9.2 8.7* 9.0  MG  --   --  1.7   GFR: Estimated Creatinine Clearance: 105.2 mL/min (A) (by C-G formula based on SCr of 0.6 mg/dL (L)). Liver Function Tests: Recent Labs  Lab 12/09/19 2258 12/11/19 0356  AST 141* 86*  ALT 87* 67*  ALKPHOS 157* 133*  BILITOT 0.9 2.5*  PROT 8.0 7.1  ALBUMIN 3.9 3.6   Recent Labs  Lab 12/09/19 2258  LIPASE 33   Recent Labs  Lab 12/10/19 0127  AMMONIA 35   Coagulation Profile: Recent Labs  Lab 12/10/19 0127  INR 1.1   Cardiac Enzymes: No results for input(s): CKTOTAL, CKMB, CKMBINDEX, TROPONINI in the last 168 hours. BNP (last 3 results) No results for input(s): PROBNP in the last 8760 hours. HbA1C: No results for input(s): HGBA1C in the last 72 hours. CBG: Recent Labs  Lab 12/10/19 1147  GLUCAP 152*   Lipid Profile: No results for input(s): CHOL, HDL, LDLCALC, TRIG, CHOLHDL, LDLDIRECT in the last 72 hours. Thyroid Function Tests: Recent Labs    12/10/19 0806  TSH 4.249   Anemia Panel: No results for input(s): VITAMINB12, FOLATE, FERRITIN, TIBC,  IRON, RETICCTPCT in the last 72 hours. Sepsis Labs: No results for input(s): PROCALCITON, LATICACIDVEN in the last 168 hours.  Recent Results (from the past 240 hour(s))  Respiratory Panel by RT PCR (Flu A&B, Covid) - Nasopharyngeal Swab     Status: None   Collection Time: 12/09/19 10:58 PM   Specimen: Nasopharyngeal Swab  Result Value Ref Range Status   SARS Coronavirus 2 by RT PCR NEGATIVE NEGATIVE Final    Comment: (NOTE) SARS-CoV-2 target nucleic acids are NOT DETECTED. The SARS-CoV-2 RNA is generally detectable in upper respiratoy specimens during the acute phase of infection. The lowest concentration of SARS-CoV-2 viral copies this assay can detect is 131 copies/mL. A negative result does not preclude SARS-Cov-2 infection and should not be used as the sole basis for treatment or other patient management decisions. A negative result may occur with  improper specimen collection/handling, submission of specimen other than nasopharyngeal swab, presence of viral mutation(s) within the areas targeted by this assay, and inadequate number of  viral copies (<131 copies/mL). A negative result must be combined with clinical observations, patient history, and epidemiological information. The expected result is Negative. Fact Sheet for Patients:  https://www.moore.com/ Fact Sheet for Healthcare Providers:  https://www.young.biz/ This test is not yet ap proved or cleared by the [place] FDA and  has been authorized for detection and/or diagnosis of SARS-CoV-2 by FDA under an Emergency Use Authorization (EUA). This EUA will remain  in effect (meaning this test can be used) for the duration of the COVID-19 declaration under Section 564(b)(1) of the Act, 21 U.S.C. section 360bbb-3(b)(1), unless the authorization is terminated or revoked sooner.    Influenza A by PCR NEGATIVE NEGATIVE Final   Influenza B by PCR NEGATIVE NEGATIVE Final    Comment:  (NOTE) The Xpert Xpress SARS-CoV-2/FLU/RSV assay is intended as an aid in  the diagnosis of influenza from Nasopharyngeal swab specimens and  should not be used as a sole basis for treatment. Nasal washings and  aspirates are unacceptable for Xpert Xpress SARS-CoV-2/FLU/RSV  testing. Fact Sheet for Patients: https://www.moore.com/ Fact Sheet for Healthcare Providers: https://www.young.biz/ This test is not yet approved or cleared by the [place] FDA and  has been authorized for detection and/or diagnosis of SARS-CoV-2 by  FDA under an Emergency Use Authorization (EUA). This EUA will remain  in effect (meaning this test can be used) for the duration of the  Covid-19 declaration under Section 564(b)(1) of the Act, 21  U.S.C. section 360bbb-3(b)(1), unless the authorization is  terminated or revoked. Performed at [facility], [address], [place], [place] [address]   MRSA PCR Screening     Status: None   Collection Time: 12/10/19  6:30 PM   Specimen: Nasal Mucosa; Nasopharyngeal  Result Value Ref Range Status   MRSA by PCR NEGATIVE NEGATIVE Final    Comment:        The GeneXpert MRSA Assay (FDA approved for NASAL specimens only), is one component of a comprehensive MRSA colonization surveillance program. It is not intended to diagnose MRSA infection nor to guide or monitor treatment for MRSA infections. Performed at [facility], [address] [place], [place] [address]          Radiology Studies: CT ABDOMEN PELVIS W CONTRAST  Result Date: 12/10/2019 CLINICAL DATA:  Abdominal distension EXAM: CT ABDOMEN AND PELVIS WITH CONTRAST TECHNIQUE: Multidetector CT imaging of the abdomen and pelvis was performed using the standard protocol following bolus administration of intravenous contrast. CONTRAST:  OMNIPAQUE IOHEXOL 300 MG/ML  SOLN COMPARISON:  May 05, 2019 FINDINGS: Lower chest: The visualized heart size within normal  limits. No pericardial fluid/thickening. No hiatal hernia. The visualized portions of the lungs are clear. Hepatobiliary: There is diffuse low density seen throughout the liver parenchyma with a slightly nodular liver contour. Again noted is hepatomegaly which measures of to 19 cm in craniocaudal dimension. No focal hepatic lesion is seen. Main portal vein is patent. No evidence of calcified gallstones, gallbladder wall thickening or biliary dilatation. Pancreas: Unremarkable. No pancreatic ductal dilatation or surrounding inflammatory changes. Spleen: Normal in size without focal abnormality. Adrenals/Urinary Tract: Both adrenal glands appear normal. The kidneys and collecting system appear normal without evidence of urinary tract calculus or hydronephrosis. There is mild superior bladder wall thickening seen. Stomach/Bowel: The stomach, small bowel, and colon are normal in appearance. No inflammatory changes, wall thickening, or obstructive findings.Scattered colonic diverticula are noted without diverticulitis. Vascular/Lymphatic: There are no enlarged mesenteric, retroperitoneal, or pelvic lymph nodes. Scattered aortic atherosclerotic  calcifications are seen without aneurysmal dilatation. Reproductive: The prostate is unremarkable. Other: No evidence of abdominal wall mass or hernia. Musculoskeletal: No acute or significant osseous findings. IMPRESSION: Mild superior bladder wall thickening which may be due to cystitis. Hepatomegaly with findings of cirrhosis. Diverticulosis without diverticulitis. Aortic Atherosclerosis (ICD10-I70.0). Electronically Signed   By: Jonna Clark M.D.   On: 12/10/2019 01:55   ECHOCARDIOGRAM COMPLETE  Result Date: 12/10/2019    ECHOCARDIOGRAM REPORT   Patient Name:   FENDER HERDER Date of Exam: 12/10/2019 Medical Rec #:  [id number]      Height:       68.0 in Accession #:    [id number]     Weight:       175.0 lb Date of Birth:  01-05-1961      BSA:          1.931 m Patient Age:     58 years       BP:           147/119 mmHg Patient Gender: M              HR:           124 bpm. Exam Location:  Inpatient Procedure: 2D Echo Indications:    Atrial Fibrillation 427.31 / I48.91  History:        Patient has prior history of Echocardiogram examinations, most                 recent 05/07/2019. Arrythmias:Atrial Flutter; Risk                 Factors:Current Smoker. Alcoholic cirrhosis of liver.  Sonographer:    Leta Jungling RDCS Referring Phys: [id number] [facility] M GADHIA IMPRESSIONS  1. Left ventricular ejection fraction, by estimation, is 70 to 75%. The left ventricle has hyperdynamic function. The left ventricle has no regional wall motion abnormalities. Left ventricular diastolic parameters are indeterminate.  2. Right ventricular systolic function is normal. The right ventricular size is normal.  3. Left atrial size was mildly dilated.  4. The mitral valve is normal in structure. No evidence of mitral valve regurgitation. No evidence of mitral stenosis.  5. The aortic valve is tricuspid. Aortic valve regurgitation is not visualized. Mild aortic valve sclerosis is present, with no evidence of aortic valve stenosis.  6. The inferior vena cava is dilated in size with >50% respiratory variability, suggesting right atrial pressure of 8 mmHg. FINDINGS  Left Ventricle: Left ventricular ejection fraction, by estimation, is 70 to 75%. The left ventricle has hyperdynamic function. The left ventricle has no regional wall motion abnormalities. The left ventricular internal cavity size was normal in size. There is no left ventricular hypertrophy. Left ventricular diastolic parameters are indeterminate. Right Ventricle: The right ventricular size is normal. No increase in right ventricular wall thickness. Right ventricular systolic function is normal. Left Atrium: Left atrial size was mildly dilated. Right Atrium: Right atrial size was normal in size. Pericardium: There is no evidence of pericardial effusion.  Mitral Valve: The mitral valve is normal in structure. Normal mobility of the mitral valve leaflets. No evidence of mitral valve regurgitation. No evidence of mitral valve stenosis. Tricuspid Valve: The tricuspid valve is normal in structure. Tricuspid valve regurgitation is trivial. No evidence of tricuspid stenosis. Aortic Valve: The aortic valve is tricuspid. Aortic valve regurgitation is not visualized. Mild aortic valve sclerosis is present, with no evidence of aortic valve stenosis. Pulmonic Valve: The pulmonic valve was normal in structure. Pulmonic  valve regurgitation is not visualized. No evidence of pulmonic stenosis. Aorta: The aortic root is normal in size and structure. Venous: The inferior vena cava is dilated in size with greater than 50% respiratory variability, suggesting right atrial pressure of 8 mmHg. IAS/Shunts: No atrial level shunt detected by color flow Doppler.  LEFT VENTRICLE PLAX 2D LVIDd:         5.18 cm LVIDs:         3.83 cm LV PW:         0.84 cm LV IVS:        0.96 cm LVOT diam:     2.20 cm LV SV:         58 LV SV Index:   30 LVOT Area:     3.80 cm  RIGHT VENTRICLE RV S prime:     14.50 cm/s TAPSE (M-mode): 1.7 cm LEFT ATRIUM             Index LA diam:        4.00 cm 2.07 cm/m LA Vol (A2C):   68.6 ml 35.53 ml/m LA Vol (A4C):   76.8 ml 39.77 ml/m LA Biplane Vol: 77.6 ml 40.19 ml/m  AORTIC VALVE LVOT Vmax:   97.90 cm/s LVOT Vmean:  72.000 cm/s LVOT VTI:    0.153 m  AORTA Ao Root diam: 3.60 cm MITRAL VALVE MV Area (PHT): 4.84 cm     SHUNTS MV Decel Time: 157 msec     Systemic VTI:  0.15 m MV E velocity: 110.33 cm/s  Systemic Diam: 2.20 cm Donato SchultzMark Skains MD Electronically signed by Donato SchultzMark Skains MD Signature Date/Time: 12/10/2019/3:54:49 PM    Final         Scheduled Meds: . aspirin EC  81 mg Oral Daily  . Chlorhexidine Gluconate Cloth  6 each Topical Daily  . diltiazem  90 mg Oral Q6H  . enoxaparin (LOVENOX) injection  40 mg Subcutaneous Q24H  . folic acid  1 mg Oral Daily    . lactulose  20 g Oral BID  . multivitamin with minerals  1 tablet Oral Daily  . nicotine  21 mg Transdermal Daily  . potassium chloride  40 mEq Oral BID  . sodium chloride flush  3 mL Intravenous Q12H  . thiamine  100 mg Oral Daily   Or  . thiamine  100 mg Intravenous Daily   Continuous Infusions: . sodium chloride    . diltiazem (CARDIZEM) infusion 10 mg/hr (12/11/19 0306)     LOS: 1 day    Time spent: 35 minutes    Kseniya Grunden D Sherryll BurgerShah, DO [facility]  If 7PM-7AM, please contact night-coverage [email or web address] 12/11/2019, 9:15 AM

## 2019-12-11 NOTE — TOC Initial Note (Signed)
Transition of Care [facility]) - Initial/Assessment Note    Patient Details  Name: Henry [name] MRN: [id number] Date of Birth: Jun 02, 1961  Transition of Care [facility]) CM/SW Contact:    Leitha Bleak, RN Phone Number: 12/11/2019, 1:47 PM  Clinical Narrative:      Patient admitted for AFIB, lives home alone. TOC consult for substance abuse.  TOC spoke with patient. He states he has tried AA and NA in the past with no success.  These meeting make him want to drink more. Patient does not drive.  Discuss with patient to talk to his support group and get there help and ask them to not bring alochol in the home. He states he will do that and is not interested in any other meeting.              Expected Discharge Plan: Home/Self Care Barriers to Discharge: Continued Medical Work up   Patient Goals and CMS Choice Patient states their goals for this hospitalization and ongoing recovery are:: to go home. CMS Medicare.gov Compare Post Acute Care list provided to:: Patient    Expected Discharge Plan and Services Expected Discharge Plan: Home/Self Care     Prior Living Arrangements/Services   Lives with:: Self          Need for Family Participation in Patient Care: Yes (Comment) Care giver support system in place?: Yes (comment)   Criminal Activity/Legal Involvement Pertinent to Current Situation/Hospitalization: No - Comment as needed  Activities of Daily Living Home Assistive Devices/Equipment: Wheelchair, Prosthesis, Eyeglasses(Lt BKA) ADL Screening (condition at time of admission) Patient's cognitive ability adequate to safely complete daily activities?: Yes Is the patient deaf or have difficulty hearing?: No Does the patient have difficulty seeing, even when wearing glasses/contacts?: No Does the patient have difficulty concentrating, remembering, or making decisions?: No Patient able to express need for assistance with ADLs?: Yes Does the patient have difficulty dressing or bathing?:  No Independently performs ADLs?: Yes (appropriate for developmental age) Does the patient have difficulty walking or climbing stairs?: Yes Weakness of Legs: Both Weakness of Arms/Hands: None  Permission Sought/Granted         Emotional Assessment      Alcohol / Substance Use: Tobacco Use, Alcohol Use Psych Involvement: No (comment)  Admission diagnosis:  Atrial fibrillation with RVR (HCC) [I48.91] Alcoholic intoxication with complication (HCC) [F10.929] Atrial fibrillation, unspecified type (HCC) [I48.91] Patient Active Problem List   Diagnosis Date Noted  . Atrial fibrillation with RVR (HCC) 12/10/2019  . 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 Atrial flutter (HCC) 05/05/2019  . Alcoholic cirrhosis of liver with ascites (HCC) 05/05/2019  . Tobacco abuse 05/05/2019   PCP:  Patient, No Pcp Per Pharmacy:   [id number] - [place], [place] - [address] [place] [address] Phone: [phone] Fax: [phone]  [id number] - [place], [place] - [address] [place] [address] Phone: [phone] Fax: [phone]  [facility] - [place],  - [address] [place] [address] Phone: [phone] Fax: [phone]

## 2019-12-12 LAB — COMPREHENSIVE METABOLIC PANEL
ALT: 76 U/L — ABNORMAL HIGH (ref 0–44)
AST: 97 U/L — ABNORMAL HIGH (ref 15–41)
Albumin: 3.6 g/dL (ref 3.5–5.0)
Alkaline Phosphatase: 147 U/L — ABNORMAL HIGH (ref 38–126)
Anion gap: 8 (ref 5–15)
BUN: 7 mg/dL (ref 6–20)
CO2: 24 mmol/L (ref 22–32)
Calcium: 9.2 mg/dL (ref 8.9–10.3)
Chloride: 101 mmol/L (ref 98–111)
Creatinine, Ser: 0.6 mg/dL — ABNORMAL LOW (ref 0.61–1.24)
GFR calc Af Amer: >60 mL/min (ref >60)
GFR calc non Af Amer: >60 mL/min (ref >60)
Glucose, Bld: 113 mg/dL — ABNORMAL HIGH (ref 70–99)
Potassium: 3.7 mmol/L (ref 3.5–5.1)
Sodium: 133 mmol/L — ABNORMAL LOW (ref 135–145)
Total Bilirubin: 1.9 mg/dL — ABNORMAL HIGH (ref 0.3–1.2)
Total Protein: 7.1 g/dL (ref 6.5–8.1)

## 2019-12-12 MED ORDER — THIAMINE HCL 100 MG PO TABS: 100.0000 mg | ORAL_TABLET | Freq: Every day | ORAL | 0 refills | Status: DC

## 2019-12-12 MED ORDER — METOPROLOL TARTRATE 25 MG PO TABS
25.0000 mg | ORAL_TABLET | Freq: Three times a day (TID) | ORAL | 2 refills | Status: DC | PRN
Start: 2019-12-12 — End: 2020-04-19

## 2019-12-12 MED ORDER — DILTIAZEM HCL ER COATED BEADS 180 MG PO CP24
360.0000 mg | ORAL_CAPSULE | Freq: Every day | ORAL | Status: DC
  Administered 2019-12-12: 360 mg via ORAL
  Filled 2019-12-12: qty 2

## 2019-12-12 NOTE — Progress Notes (Signed)
Discharge patient after removal of IV access. Rolled to vehicle by wheelchair and had dressed himself. All information packet given.

## 2019-12-12 NOTE — Progress Notes (Signed)
Progress Note  Patient Name: Henry [name] Date of Encounter: 12/12/2019  Primary Cardiologist: Nona Dell, MD   Subjective   No complaints  Inpatient Medications    Scheduled Meds: . aspirin EC  81 mg Oral Daily  . Chlorhexidine Gluconate Cloth  6 each Topical Daily  . diltiazem  90 mg Oral Q6H  . enoxaparin (LOVENOX) injection  40 mg Subcutaneous Q24H  . folic acid  1 mg Oral Daily  . lactulose  20 g Oral BID  . multivitamin with minerals  1 tablet Oral Daily  . nicotine  21 mg Transdermal Daily  . sodium chloride flush  3 mL Intravenous Q12H  . thiamine  100 mg Oral Daily   Or  . thiamine  100 mg Intravenous Daily   Continuous Infusions: . sodium chloride    . diltiazem (CARDIZEM) infusion Stopped (12/11/19 1302)   PRN Meds: sodium chloride, bisacodyl, labetalol, LORazepam **OR** LORazepam, metoprolol tartrate, ondansetron **OR** ondansetron (ZOFRAN) IV, polyethylene glycol, sodium chloride flush   Vital Signs    Vitals:   12/12/19 0450 12/12/19 0637 12/12/19 0700 12/12/19 0715  BP:  135/85 116/88   Pulse: 86  83   Resp:   17   Temp:      TempSrc:      SpO2:   92%   Weight:    81.7 kg  Height:        Intake/Output Summary (Last 24 hours) at 12/12/2019 0808 Last data filed at 12/11/2019 2229 Gross per 24 hour  Intake 807.23 ml  Output 250 ml  Net 557.23 ml   Last 3 Weights 12/12/2019 12/11/2019 12/10/2019  Weight (lbs) 180 lb 1.9 oz 181 lb 182 lb 5.1 oz  Weight (kg) 81.7 kg 82.1 kg 82.7 kg      Telemetry    SR - Personally Reviewed  ECG    n/a - Personally Reviewed  Physical Exam   GEN: No acute distress.   Neck: No JVD Cardiac: RRR, no murmurs, rubs, or gallops.  Respiratory: Clear to auscultation bilaterally. GI: Soft, nontender, non-distended  MS: No edema; No deformity. Neuro:  Nonfocal  Psych: Normal affect   Labs    High Sensitivity Troponin:   Recent Labs  Lab 12/10/19 0343 12/10/19 0806 12/10/19 1117  TROPONINIHS 17  15 11       Chemistry Recent Labs  Lab 12/09/19 2258 12/09/19 2258 12/10/19 0343 12/11/19 0356 12/12/19 0428  NA 141   < > 136 134* 133*  K 3.6   < > 3.5 3.1* 3.7  CL 107   < > 104 99 101  CO2 20*   < > 20* 25 24  GLUCOSE 107*   < > 103* 117* 113*  BUN 11   < > 10 6 7   CREATININE 0.74   < > 0.72 0.60* 0.60*  CALCIUM 9.2   < > 8.7* 9.0 9.2  PROT 8.0  --   --  7.1 7.1  ALBUMIN 3.9  --   --  3.6 3.6  AST 141*  --   --  86* 97*  ALT 87*  --   --  67* 76*  ALKPHOS 157*  --   --  133* 147*  BILITOT 0.9  --   --  2.5* 1.9*  GFRNONAA >60   < > >60 >60 >60  GFRAA >60   < > >60 >60 >60  ANIONGAP 14   < > 12 10 8    < > = values in  this interval not displayed.     Hematology Recent Labs  Lab 12/09/19 2258 12/10/19 0343  WBC 8.1 6.7  RBC 4.31 4.06*  HGB 15.4 14.5  HCT 43.0 41.4  MCV 99.8 102.0*  MCH 35.7* 35.7*  MCHC 35.8 35.0  RDW 13.6 13.8  PLT 219 191    BNPNo results for input(s): BNP, PROBNP in the last 168 hours.   DDimer No results for input(s): DDIMER in the last 168 hours.   Radiology    ECHOCARDIOGRAM COMPLETE  Result Date: 12/10/2019    ECHOCARDIOGRAM REPORT   Patient Name:   Henry [name] Date of Exam: 12/10/2019 Medical Rec #:  [id number]      Height:       68.0 in Accession #:    [id number]     Weight:       175.0 lb Date of Birth:  [birth year]      BSA:          1.931 m Patient Age:    58 years       BP:           147/119 mmHg Patient Gender: M              HR:           124 bpm. Exam Location:  Inpatient Procedure: 2D Echo Indications:    Atrial Fibrillation 427.31 / I48.91  History:        Patient has prior history of Echocardiogram examinations, most                 recent 05/07/2019. Arrythmias:Atrial Flutter; Risk                 Factors:Current Smoker. Alcoholic cirrhosis of liver.  Sonographer:    Leta Jungling RDCS Referring Phys: [id number] [facility] M GADHIA IMPRESSIONS  1. Left ventricular ejection fraction, by estimation, is 70 to 75%. The left ventricle  has hyperdynamic function. The left ventricle has no regional wall motion abnormalities. Left ventricular diastolic parameters are indeterminate.  2. Right ventricular systolic function is normal. The right ventricular size is normal.  3. Left atrial size was mildly dilated.  4. The mitral valve is normal in structure. No evidence of mitral valve regurgitation. No evidence of mitral stenosis.  5. The aortic valve is tricuspid. Aortic valve regurgitation is not visualized. Mild aortic valve sclerosis is present, with no evidence of aortic valve stenosis.  6. The inferior vena cava is dilated in size with >50% respiratory variability, suggesting right atrial pressure of 8 mmHg. FINDINGS  Left Ventricle: Left ventricular ejection fraction, by estimation, is 70 to 75%. The left ventricle has hyperdynamic function. The left ventricle has no regional wall motion abnormalities. The left ventricular internal cavity size was normal in size. There is no left ventricular hypertrophy. Left ventricular diastolic parameters are indeterminate. Right Ventricle: The right ventricular size is normal. No increase in right ventricular wall thickness. Right ventricular systolic function is normal. Left Atrium: Left atrial size was mildly dilated. Right Atrium: Right atrial size was normal in size. Pericardium: There is no evidence of pericardial effusion. Mitral Valve: The mitral valve is normal in structure. Normal mobility of the mitral valve leaflets. No evidence of mitral valve regurgitation. No evidence of mitral valve stenosis. Tricuspid Valve: The tricuspid valve is normal in structure. Tricuspid valve regurgitation is trivial. No evidence of tricuspid stenosis. Aortic Valve: The aortic valve is tricuspid. Aortic valve regurgitation is not visualized. Mild aortic valve sclerosis  is present, with no evidence of aortic valve stenosis. Pulmonic Valve: The pulmonic valve was normal in structure. Pulmonic valve regurgitation is not  visualized. No evidence of pulmonic stenosis. Aorta: The aortic root is normal in size and structure. Venous: The inferior vena cava is dilated in size with greater than 50% respiratory variability, suggesting right atrial pressure of 8 mmHg. IAS/Shunts: No atrial level shunt detected by color flow Doppler.  LEFT VENTRICLE PLAX 2D LVIDd:         5.18 cm LVIDs:         3.83 cm LV PW:         0.84 cm LV IVS:        0.96 cm LVOT diam:     2.20 cm LV SV:         58 LV SV Index:   30 LVOT Area:     3.80 cm  RIGHT VENTRICLE RV S prime:     14.50 cm/s TAPSE (M-mode): 1.7 cm LEFT ATRIUM             Index LA diam:        4.00 cm 2.07 cm/m LA Vol (A2C):   68.6 ml 35.53 ml/m LA Vol (A4C):   76.8 ml 39.77 ml/m LA Biplane Vol: 77.6 ml 40.19 ml/m  AORTIC VALVE LVOT Vmax:   97.90 cm/s LVOT Vmean:  72.000 cm/s LVOT VTI:    0.153 m  AORTA Ao Root diam: 3.60 cm MITRAL VALVE MV Area (PHT): 4.84 cm     SHUNTS MV Decel Time: 157 msec     Systemic VTI:  0.15 m MV E velocity: 110.33 cm/s  Systemic Diam: 2.20 cm Candee Furbish MD Electronically signed by Candee Furbish MD Signature Date/Time: 12/10/2019/3:54:49 PM    Final     Cardiac Studies     Patient Profile     Henry [name] is a 59 y.o. male with a hx of EtOH abuse and cirrhosis and prior aflutter who is being seen today for the evaluation of tachycardia  at the request of Dr Manuella Ghazi.   Assessment & Plan    1. Afib with RVR - prior history of aflutter with RVR, presents with afib with RVR this admission -managemnt complicated by EtOH abuse and lack of compliance - low CHADS2Vasc score, not on anticoag  - yesterday started on oral dilt 90mg  every 6 ours, goal to wean dilt gtt which is now off - bp's tolerating regimen - today back in SR. On talking with him he is not sure if he was taking his dilt at home or not - change back to long acting dilt 360mg . WOuld add lopressor 25mg  q 8 hrs prn palpitations on his discharge regimen.   We will sign of inpatient care  and arrange f/u  For questions or updates, please contact [facility] Please consult [email or web address] for contact info under        Signed, Carlyle Dolly, MD  12/12/2019, 8:08 AM

## 2019-12-12 NOTE — Discharge Summary (Signed)
Physician Discharge Summary  Henry [name] ZOX:[id number] DOB: November 17, 1960 DOA: 12/09/2019  PCP: Patient, No Pcp Per  Admit date: 12/09/2019  Discharge date: 12/12/2019  Admitted From:Home  Disposition:  Home  Recommendations for Outpatient Follow-up:  1. Follow up with PCP in 1-2 weeks 2. Follow-up with cardiology with appointment listed on 12/26/2019 3. Continue on Cardizem as prior with metoprolol 25 mg every 8 hours as needed for palpitations/tachycardia 4. Alcohol cessation thoroughly discussed and patient plans to discontinue alcohol use  Home Health: None  Equipment/Devices: None  Discharge Condition: Stable  CODE STATUS: Full  Diet recommendation: Heart Healthy  Brief/Interim Summary: Per HPI: Henry Ebeling Wittyis a 58 y.o.malewith medical history significant ofatrial flutter, alcohol abuse who presented to the ER with abdominal pain. Patient has a history of alcoholic liver cirrhosis and alcohol abuse as well as prior history of atrial flutter. Has occasionally been using alcohol. Did consume alcohol today. Alcohol level was 323 on presentation. Started having abdominal pain, bloating, right upper quadrant discomfort and difficulty with urination over the last 4 to 5 days. He also reports feeling increasingly weak. He has had intermittent palpitations for the last several months. No chest pain, fever, chills, cough,diarrhea, dysuria.  4/25:Patient seen and evaluated at bedside this a.m. He is noted to have ongoing mild shortness of breath and has been started on Cardizem IV drip with heart rates noted to be 120-130 bpm. Prior 2D echocardiogram on 04/2019 with LVEF 60-65% and repeat 2D echocardiogram pending. TSH within normal limits at 4.249. Continue on CIWA protocol and monitor repeat labs in a.m. Metoprolol IV pushes ordered every 6 hours as needed to assist with weaning of Cardizem drip. We will plan to resume home oral Cardizem once heart rates are more  stable. Anticipate discharge after discussion with cardiology in the next 24 hours if no significant 2D echocardiogram findings and if stable on home Cardizem. Chads score of 0. Plan to keep on aspirin as prescribed.  4/26: Continues to remain on Cardizem drip with elevated heart rates.  Seen by cardiology and resumed on oral Cardizem.  Will attempt to wean today.  No significant findings on TSH or 2D echocardiogram.  Does not have any alcohol withdrawal symptoms currently.  Continue to monitor and wean Cardizem.  4/27: Patient has remained in sinus rhythm and is off Cardizem drip.  He is tolerating his usual home Cardizem and will be prescribed metoprolol as needed for any heart rate elevations or palpitations.  2D echocardiogram findings as noted below with no acute changes and LVEF 70-75%.  He is stable for discharge and is overall feeling well.  He has been seen by cardiology and will follow up with him in the outpatient setting as noted above.  Discharge Diagnoses:  Principal Problem:   Atrial fibrillation with RVR (HCC) Active Problems:   Atrial flutter (HCC)   Alcoholic cirrhosis of liver with ascites (HCC)   Tobacco abuse  Principal discharge diagnosis: Atrial fibrillation with RVR with ongoing alcohol abuse and questionable medication compliance.  Discharge Instructions  Discharge Instructions    Diet - low sodium heart healthy   Complete by: As directed    Increase activity slowly   Complete by: As directed      Allergies as of 12/12/2019   No Known Allergies     Medication List    TAKE these medications   aspirin 81 MG EC tablet Take 1 tablet (81 mg total) by mouth daily.   diltiazem 360 MG 24 hr  capsule Commonly known as: CARDIZEM CD Take 1 capsule (360 mg total) by mouth daily.   folic acid 1 MG tablet Commonly known as: FOLVITE Take 1 tablet (1 mg total) by mouth daily.   furosemide 40 MG tablet Commonly known as: LASIX Take 1 tablet (40 mg total) by mouth  daily.   hydrALAZINE 25 MG tablet Commonly known as: APRESOLINE Take 25 mg by mouth daily.   lactulose 10 GM/15ML solution Commonly known as: CHRONULAC Take 30 mLs (20 g total) by mouth 2 (two) times daily. What changed: when to take this   metoprolol tartrate 25 MG tablet Commonly known as: LOPRESSOR Take 1 tablet (25 mg total) by mouth every 8 (eight) hours as needed (palpitations or tachycardia).   multivitamin with minerals Tabs tablet Take 1 tablet by mouth daily.   spironolactone 50 MG tablet Commonly known as: ALDACTONE Take 1 tablet (50 mg total) by mouth daily.   thiamine 100 MG tablet Take 1 tablet (100 mg total) by mouth daily.   traZODone 100 MG tablet Commonly known as: DESYREL Take 100 mg by mouth at bedtime.      Follow-up Information    Erma Heritage, PA-C Follow up on 12/26/2019.   Specialties: Physician Assistant, Cardiology Why: Cardiology Hospital follow-up on 12/26/2019 at 3:30 PM. Contact information: [address] [place] [address] [phone]          No Known Allergies  Consultations:  Cardiology   Procedures/Studies: CT ABDOMEN PELVIS W CONTRAST  Result Date: 12/10/2019 CLINICAL DATA:  Abdominal distension EXAM: CT ABDOMEN AND PELVIS WITH CONTRAST TECHNIQUE: Multidetector CT imaging of the abdomen and pelvis was performed using the standard protocol following bolus administration of intravenous contrast. CONTRAST:  13mL OMNIPAQUE IOHEXOL 300 MG/ML  SOLN COMPARISON:  May 05, 2019 FINDINGS: Lower chest: The visualized heart size within normal limits. No pericardial fluid/thickening. No hiatal hernia. The visualized portions of the lungs are clear. Hepatobiliary: There is diffuse low density seen throughout the liver parenchyma with a slightly nodular liver contour. Again noted is hepatomegaly which measures of to 19 cm in craniocaudal dimension. No focal hepatic lesion is seen. Main portal vein is patent. No evidence of  calcified gallstones, gallbladder wall thickening or biliary dilatation. Pancreas: Unremarkable. No pancreatic ductal dilatation or surrounding inflammatory changes. Spleen: Normal in size without focal abnormality. Adrenals/Urinary Tract: Both adrenal glands appear normal. The kidneys and collecting system appear normal without evidence of urinary tract calculus or hydronephrosis. There is mild superior bladder wall thickening seen. Stomach/Bowel: The stomach, small bowel, and colon are normal in appearance. No inflammatory changes, wall thickening, or obstructive findings.Scattered colonic diverticula are noted without diverticulitis. Vascular/Lymphatic: There are no enlarged mesenteric, retroperitoneal, or pelvic lymph nodes. Scattered aortic atherosclerotic calcifications are seen without aneurysmal dilatation. Reproductive: The prostate is unremarkable. Other: No evidence of abdominal wall mass or hernia. Musculoskeletal: No acute or significant osseous findings. IMPRESSION: Mild superior bladder wall thickening which may be due to cystitis. Hepatomegaly with findings of cirrhosis. Diverticulosis without diverticulitis. Aortic Atherosclerosis (ICD10-I70.0). Electronically Signed   By: Prudencio Pair M.D.   On: 12/10/2019 01:55   ECHOCARDIOGRAM COMPLETE  Result Date: 12/10/2019    ECHOCARDIOGRAM REPORT   Patient Name:   JALYN ROSERO Date of Exam: 12/10/2019 Medical Rec #:  [id number]      Height:       68.0 in Accession #:    [id number]     Weight:       175.0 lb  Date of Birth:  1960/09/02      BSA:          1.931 m Patient Age:    58 years       BP:           147/119 mmHg Patient Gender: M              HR:           124 bpm. Exam Location:  Inpatient Procedure: 2D Echo Indications:    Atrial Fibrillation 427.31 / I48.91  History:        Patient has prior history of Echocardiogram examinations, most                 recent 05/07/2019. Arrythmias:Atrial Flutter; Risk                 Factors:Current Smoker.  Alcoholic cirrhosis of liver.  Sonographer:    Leta Jungling RDCS Referring Phys: [id number] [facility] IMPRESSIONS  1. Left ventricular ejection fraction, by estimation, is 70 to 75%. The left ventricle has hyperdynamic function. The left ventricle has no regional wall motion abnormalities. Left ventricular diastolic parameters are indeterminate.  2. Right ventricular systolic function is normal. The right ventricular size is normal.  3. Left atrial size was mildly dilated.  4. The mitral valve is normal in structure. No evidence of mitral valve regurgitation. No evidence of mitral stenosis.  5. The aortic valve is tricuspid. Aortic valve regurgitation is not visualized. Mild aortic valve sclerosis is present, with no evidence of aortic valve stenosis.  6. The inferior vena cava is dilated in size with >50% respiratory variability, suggesting right atrial pressure of 8 mmHg. FINDINGS  Left Ventricle: Left ventricular ejection fraction, by estimation, is 70 to 75%. The left ventricle has hyperdynamic function. The left ventricle has no regional wall motion abnormalities. The left ventricular internal cavity size was normal in size. There is no left ventricular hypertrophy. Left ventricular diastolic parameters are indeterminate. Right Ventricle: The right ventricular size is normal. No increase in right ventricular wall thickness. Right ventricular systolic function is normal. Left Atrium: Left atrial size was mildly dilated. Right Atrium: Right atrial size was normal in size. Pericardium: There is no evidence of pericardial effusion. Mitral Valve: The mitral valve is normal in structure. Normal mobility of the mitral valve leaflets. No evidence of mitral valve regurgitation. No evidence of mitral valve stenosis. Tricuspid Valve: The tricuspid valve is normal in structure. Tricuspid valve regurgitation is trivial. No evidence of tricuspid stenosis. Aortic Valve: The aortic valve is tricuspid. Aortic valve  regurgitation is not visualized. Mild aortic valve sclerosis is present, with no evidence of aortic valve stenosis. Pulmonic Valve: The pulmonic valve was normal in structure. Pulmonic valve regurgitation is not visualized. No evidence of pulmonic stenosis. Aorta: The aortic root is normal in size and structure. Venous: The inferior vena cava is dilated in size with greater than 50% respiratory variability, suggesting right atrial pressure of 8 mmHg. IAS/Shunts: No atrial level shunt detected by color flow Doppler.  LEFT VENTRICLE PLAX 2D LVIDd:         5.18 cm LVIDs:         3.83 cm LV PW:         0.84 cm LV IVS:        0.96 cm LVOT diam:     2.20 cm LV SV:         58 LV SV Index:   30 LVOT  Area:     3.80 cm  RIGHT VENTRICLE RV S prime:     14.50 cm/s TAPSE (M-mode): 1.7 cm LEFT ATRIUM             Index LA diam:        4.00 cm 2.07 cm/m LA Vol (A2C):   68.6 ml 35.53 ml/m LA Vol (A4C):   76.8 ml 39.77 ml/m LA Biplane Vol: 77.6 ml 40.19 ml/m  AORTIC VALVE LVOT Vmax:   97.90 cm/s LVOT Vmean:  72.000 cm/s LVOT VTI:    0.153 m  AORTA Ao Root diam: 3.60 cm MITRAL VALVE MV Area (PHT): 4.84 cm     SHUNTS MV Decel Time: 157 msec     Systemic VTI:  0.15 m MV E velocity: 110.33 cm/s  Systemic Diam: 2.20 cm Donato Schultz MD Electronically signed by Donato Schultz MD Signature Date/Time: 12/10/2019/3:54:49 PM    Final      Discharge Exam: Vitals:   12/12/19 0834 12/12/19 0900  BP:  129/86  Pulse:  93  Resp:  16  Temp:    SpO2: 96% 96%   Vitals:   12/12/19 0715 12/12/19 0800 12/12/19 0834 12/12/19 0900  BP:  116/88  129/86  Pulse:  88  93  Resp:  16  16  Temp:      TempSrc:      SpO2:  92% 96% 96%  Weight: 81.7 kg     Height:        General: Pt is alert, awake, not in acute distress Cardiovascular: RRR, S1/S2 +, no rubs, no gallops Respiratory: CTA bilaterally, no wheezing, no rhonchi Abdominal: Soft, NT, ND, bowel sounds + Extremities: no edema, 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     Microbiology: Recent Results (from the past 240 hour(s))  Respiratory Panel by RT PCR (Flu A&B, Covid) - Nasopharyngeal Swab     Status: None   Collection Time: 12/09/19 10:58 PM   Specimen: Nasopharyngeal Swab  Result Value Ref Range Status   SARS Coronavirus 2 by RT PCR NEGATIVE NEGATIVE Final    Comment: (NOTE) SARS-CoV-2 target nucleic acids are NOT DETECTED. The SARS-CoV-2 RNA is generally detectable in upper respiratoy specimens during the acute phase of infection. The lowest concentration of SARS-CoV-2 viral copies this assay can detect is 131 copies/mL. A negative result does not preclude SARS-Cov-2 infection and should not be used as the sole basis for treatment or other patient management decisions. A negative result may occur with  improper specimen collection/handling, submission of specimen other than nasopharyngeal swab, presence of viral mutation(s) within the areas targeted by this assay, and inadequate number of viral copies (<131 copies/mL). A negative result must be combined with clinical observations, patient history, and epidemiological information. The expected result is Negative. Fact Sheet for Patients:  https://www.moore.com/ Fact Sheet for Healthcare Providers:  https://www.young.biz/ This test is not yet ap proved or cleared by the [place] FDA and  has been authorized for detection and/or diagnosis of SARS-CoV-2 by FDA under an Emergency Use Authorization (EUA). This EUA will remain  in effect (meaning this test can be used) for the duration of the COVID-19 declaration under Section 564(b)(1) of the Act, 21 U.S.C. section 360bbb-3(b)(1), unless the authorization is terminated or revoked sooner.    Influenza A by PCR NEGATIVE NEGATIVE Final   Influenza B by PCR  NEGATIVE NEGATIVE  Final    Comment: (NOTE) The Xpert Xpress SARS-CoV-2/FLU/RSV assay is intended as an aid in  the diagnosis of influenza from Nasopharyngeal swab specimens and  should not be used as a sole basis for treatment. Nasal washings and  aspirates are unacceptable for Xpert Xpress SARS-CoV-2/FLU/RSV  testing. Fact Sheet for Patients: https://www.moore.com/ Fact Sheet for Healthcare Providers: https://www.young.biz/ This test is not yet approved or cleared by the [place] FDA and  has been authorized for detection and/or diagnosis of SARS-CoV-2 by  FDA under an Emergency Use Authorization (EUA). This EUA will remain  in effect (meaning this test can be used) for the duration of the  Covid-19 declaration under Section 564(b)(1) of the Act, 21  U.S.C. section 360bbb-3(b)(1), unless the authorization is  terminated or revoked. Performed at [facility], [address], [place], [place] [address]   MRSA PCR Screening     Status: None   Collection Time: 12/10/19  6:30 PM   Specimen: Nasal Mucosa; Nasopharyngeal  Result Value Ref Range Status   MRSA by PCR NEGATIVE NEGATIVE Final    Comment:        The GeneXpert MRSA Assay (FDA approved for NASAL specimens only), is one component of a comprehensive MRSA colonization surveillance program. It is not intended to diagnose MRSA infection nor to guide or monitor treatment for MRSA infections. Performed at [facility], [address], [place], [place] [address]      Labs: BNP (last 3 results) No results for input(s): BNP in the last 8760 hours. Basic Metabolic Panel: Recent Labs  Lab 12/09/19 2258 12/10/19 0343 12/11/19 0356 12/12/19 0428  NA 141 136 134* 133*  K 3.6 3.5 3.1* 3.7  CL 107 104 99 101  CO2 20* 20* 25 24  GLUCOSE 107* 103* 117* 113*  BUN 11 10 6 7   CREATININE 0.74 0.72 0.60* 0.60*  CALCIUM 9.2 8.7* 9.0 9.2  MG  --   --  1.7  --    Liver Function  Tests: Recent Labs  Lab 12/09/19 2258 12/11/19 0356 12/12/19 0428  AST 141* 86* 97*  ALT 87* 67* 76*  ALKPHOS 157* 133* 147*  BILITOT 0.9 2.5* 1.9*  PROT 8.0 7.1 7.1  ALBUMIN 3.9 3.6 3.6   Recent Labs  Lab 12/09/19 2258  LIPASE 33   Recent Labs  Lab 12/10/19 0127  AMMONIA 35   CBC: Recent Labs  Lab 12/09/19 2258 12/10/19 0343  WBC 8.1 6.7  NEUTROABS 3.6  --   HGB 15.4 14.5  HCT 43.0 41.4  MCV 99.8 102.0*  PLT 219 191   Cardiac Enzymes: No results for input(s): CKTOTAL, CKMB, CKMBINDEX, TROPONINI in the last 168 hours. BNP: Invalid input(s): POCBNP CBG: Recent Labs  Lab 12/10/19 1147  GLUCAP 152*   D-Dimer No results for input(s): DDIMER in the last 72 hours. Hgb A1c No results for input(s): HGBA1C in the last 72 hours. Lipid Profile No results for input(s): CHOL, HDL, LDLCALC, TRIG, CHOLHDL, LDLDIRECT in the last 72 hours. Thyroid function studies Recent Labs    12/10/19 0806  TSH 4.249   Anemia work up No results for input(s): VITAMINB12, FOLATE, FERRITIN, TIBC, IRON, RETICCTPCT in the last 72 hours. Urinalysis    Component Value Date/Time   COLORURINE YELLOW 12/10/2019 0023   APPEARANCEUR CLEAR 12/10/2019 0023   LABSPEC 1.013 12/10/2019 0023   PHURINE 5.0 12/10/2019 0023   GLUCOSEU NEGATIVE 12/10/2019 0023   HGBUR SMALL (A) 12/10/2019 0023   BILIRUBINUR NEGATIVE 12/10/2019 0023  KETONESUR 5 (A) 12/10/2019 0023   PROTEINUR NEGATIVE 12/10/2019 0023   NITRITE NEGATIVE 12/10/2019 0023   LEUKOCYTESUR NEGATIVE 12/10/2019 0023   Sepsis Labs Invalid input(s): PROCALCITONIN,  WBC,  LACTICIDVEN Microbiology Recent Results (from the past 240 hour(s))  Respiratory Panel by RT PCR (Flu A&B, Covid) - Nasopharyngeal Swab     Status: None   Collection Time: 12/09/19 10:58 PM   Specimen: Nasopharyngeal Swab  Result Value Ref Range Status   SARS Coronavirus 2 by RT PCR NEGATIVE NEGATIVE Final    Comment: (NOTE) SARS-CoV-2 target nucleic acids are  NOT DETECTED. The SARS-CoV-2 RNA is generally detectable in upper respiratoy specimens during the acute phase of infection. The lowest concentration of SARS-CoV-2 viral copies this assay can detect is 131 copies/mL. A negative result does not preclude SARS-Cov-2 infection and should not be used as the sole basis for treatment or other patient management decisions. A negative result may occur with  improper specimen collection/handling, submission of specimen other than nasopharyngeal swab, presence of viral mutation(s) within the areas targeted by this assay, and inadequate number of viral copies (<131 copies/mL). A negative result must be combined with clinical observations, patient history, and epidemiological information. The expected result is Negative. Fact Sheet for Patients:  https://www.moore.com/https://www.fda.gov/media/142436/download Fact Sheet for Healthcare Providers:  https://www.young.biz/https://www.fda.gov/media/142435/download This test is not yet ap proved or cleared by the [place] FDA and  has been authorized for detection and/or diagnosis of SARS-CoV-2 by FDA under an Emergency Use Authorization (EUA). This EUA will remain  in effect (meaning this test can be used) for the duration of the COVID-19 declaration under Section 564(b)(1) of the Act, 21 U.S.C. section 360bbb-3(b)(1), unless the authorization is terminated or revoked sooner.    Influenza A by PCR NEGATIVE NEGATIVE Final   Influenza B by PCR NEGATIVE NEGATIVE Final    Comment: (NOTE) The Xpert Xpress SARS-CoV-2/FLU/RSV assay is intended as an aid in  the diagnosis of influenza from Nasopharyngeal swab specimens and  should not be used as a sole basis for treatment. Nasal washings and  aspirates are unacceptable for Xpert Xpress SARS-CoV-2/FLU/RSV  testing. Fact Sheet for Patients: https://www.moore.com/https://www.fda.gov/media/142436/download Fact Sheet for Healthcare Providers: https://www.young.biz/https://www.fda.gov/media/142435/download This test is not yet approved or  cleared by the [place] FDA and  has been authorized for detection and/or diagnosis of SARS-CoV-2 by  FDA under an Emergency Use Authorization (EUA). This EUA will remain  in effect (meaning this test can be used) for the duration of the  Covid-19 declaration under Section 564(b)(1) of the Act, 21  U.S.C. section 360bbb-3(b)(1), unless the authorization is  terminated or revoked. Performed at [facility], [address] [place], [place] [address]   MRSA PCR Screening     Status: None   Collection Time: 12/10/19  6:30 PM   Specimen: Nasal Mucosa; Nasopharyngeal  Result Value Ref Range Status   MRSA by PCR NEGATIVE NEGATIVE Final    Comment:        The GeneXpert MRSA Assay (FDA approved for NASAL specimens only), is one component of a comprehensive MRSA colonization surveillance program. It is not intended to diagnose MRSA infection nor to guide or monitor treatment for MRSA infections. Performed at [facility], [address] [place], [place] [address]      Time coordinating discharge: 35 minutes  SIGNED:   Erick BlinksPratik D Aziyah Provencal, DO [facility] 12/12/2019, 10:08 AM  If 7PM-7AM, please contact night-coverage [email or web address]

## 2019-12-12 NOTE — TOC Transition Note (Signed)
Transition of Care [facility]) - CM/SW Discharge Note   Patient Details  Name: Henry [name] MRN: [id number] Date of Birth: 03-16-1961  [facility]) CM/SW Contact:  Leitha Bleak, RN Phone Number: 12/12/2019, 10:34 AM   Clinical Narrative:   Patient discharging home today. Provided patient with PCP list. Patient states he see doctor at the  [facility]  He does not like doing zoom visits. Advised patient to continue to follow up with them until he can find another PCP.    Barriers to Discharge: Barriers Resolved   Patient Goals and CMS Choice Patient states their goals for this hospitalization and ongoing recovery are:: to go home. CMS Medicare.gov Compare Post Acute Care list provided to:: Patient

## 2019-12-26 ENCOUNTER — Ambulatory Visit: Payer: Medicaid Other | Admitting: Student
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
## 2020-01-08 ENCOUNTER — Ambulatory Visit (INDEPENDENT_AMBULATORY_CARE_PROVIDER_SITE_OTHER): Payer: Medicaid Other | Admitting: Physician Assistant

## 2020-01-08 ENCOUNTER — Encounter: Payer: Self-pay | Admitting: Physician Assistant

## 2020-01-08 ENCOUNTER — Other Ambulatory Visit: Payer: Self-pay

## 2020-01-08 VITALS — BP 110/68 | HR 82 | Ht 68.0 in | Wt 196.8 lb

## 2020-01-08 DIAGNOSIS — Z720 Tobacco use: Secondary | ICD-10-CM

## 2020-01-08 DIAGNOSIS — I4892 Unspecified atrial flutter: Secondary | ICD-10-CM

## 2020-01-08 DIAGNOSIS — F1010 Alcohol abuse, uncomplicated: Secondary | ICD-10-CM

## 2020-01-08 DIAGNOSIS — I480 Paroxysmal atrial fibrillation: Secondary | ICD-10-CM

## 2020-01-08 NOTE — Patient Instructions (Signed)
Medication Instructions:  Your physician recommends that you continue on your current medications as directed. Please refer to the Current Medication list given to you today.  *If you need a refill on your cardiac medications before your next appointment, please call your pharmacy*    Follow-Up: At [facility], you and your health needs are our priority.  As part of our continuing mission to provide you with exceptional heart care, we have created designated Provider Care Teams.  These Care Teams include your primary Cardiologist (physician) and Advanced Practice Providers (APPs -  Physician Assistants and Nurse Practitioners) who all work together to provide you with the care you need, when you need it.  We recommend signing up for the patient portal called "MyChart".  Sign up information is provided on this After Visit Summary.  MyChart is used to connect with patients for Virtual Visits (Telemedicine).  Patients are able to view lab/test results, encounter notes, upcoming appointments, etc.  Non-urgent messages can be sent to your provider as well.   To learn more about what you can do with MyChart, go to [email or web address].    Your next appointment:   3 month(s)  The format for your next appointment:   In Person  Provider:   Rozann Lesches, MD   Other Instructions  Steps to Quit Smoking Smoking tobacco is the leading cause of preventable death. It can affect almost every organ in the body. Smoking puts you and people around you at risk for many serious, long-lasting (chronic) diseases. Quitting smoking can be hard, but it is one of the best things that you can do for your health. It is never too late to quit. How do I get ready to quit? When you decide to quit smoking, make a plan to help you succeed. Before you quit:  Pick a date to quit. Set a date within the next 2 weeks to give you time to prepare.  Write down the reasons why you are quitting. Keep this list in places  where you will see it often.  Tell your family, friends, and co-workers that you are quitting. Their support is important.  Talk with your doctor about the choices that may help you quit.  Find out if your health insurance will pay for these treatments.  Know the people, places, things, and activities that make you want to smoke (triggers). Avoid them. What first steps can I take to quit smoking?  Throw away all cigarettes at home, at work, and in your car.  Throw away the things that you use when you smoke, such as ashtrays and lighters.  Clean your car. Make sure to empty the ashtray.  Clean your home, including curtains and carpets. What can I do to help me quit smoking? Talk with your doctor about taking medicines and seeing a counselor at the same time. You are more likely to succeed when you do both.  If you are pregnant or breastfeeding, talk with your doctor about counseling or other ways to quit smoking. Do not take medicine to help you quit smoking unless your doctor tells you to do so. To quit smoking: Quit right away  Quit smoking totally, instead of slowly cutting back on how much you smoke over a period of time.  Go to counseling. You are more likely to quit if you go to counseling sessions regularly. Take medicine You may take medicines to help you quit. Some medicines need a prescription, and some you can buy over-the-counter. Some medicines may  contain a drug called nicotine to replace the nicotine in cigarettes. Medicines may:  Help you to stop having the desire to smoke (cravings).  Help to stop the problems that come when you stop smoking (withdrawal symptoms). Your doctor may ask you to use:  Nicotine patches, gum, or lozenges.  Nicotine inhalers or sprays.  Non-nicotine medicine that is taken by mouth. Find resources Find resources and other ways to help you quit smoking and remain smoke-free after you quit. These resources are most helpful when you use  them often. They include:  Online chats with a Veterinary surgeon.  Phone quitlines.  Printed Materials engineer.  Support groups or group counseling.  Text messaging programs.  Mobile phone apps. Use apps on your mobile phone or tablet that can help you stick to your quit plan. There are many free apps for mobile phones and tablets as well as websites. Examples include Quit Guide from the [employer] and smokefree.gov  What things can I do to make it easier to quit?   Talk to your family and friends. Ask them to support and encourage you.  Call a phone quitline (1-800-QUIT-NOW), reach out to support groups, or work with a Veterinary surgeon.  Ask people who smoke to not smoke around you.  Avoid places that make you want to smoke, such as: ? Bars. ? Parties. ? Smoke-break areas at work.  Spend time with people who do not smoke.  Lower the stress in your life. Stress can make you want to smoke. Try these things to help your stress: ? Getting regular exercise. ? Doing deep-breathing exercises. ? Doing yoga. ? Meditating. ? Doing a body scan. To do this, close your eyes, focus on one area of your body at a time from head to toe. Notice which parts of your body are tense. Try to relax the muscles in those areas. How will I feel when I quit smoking? Day 1 to 3 weeks Within the first 24 hours, you may start to have some problems that come from quitting tobacco. These problems are very bad 2-3 days after you quit, but they do not often last for more than 2-3 weeks. You may get these symptoms:  Mood swings.  Feeling restless, nervous, angry, or annoyed.  Trouble concentrating.  Dizziness.  Strong desire for high-sugar foods and nicotine.  Weight gain.  Trouble pooping (constipation).  Feeling like you may vomit (nausea).  Coughing or a sore throat.  Changes in how the medicines that you take for other issues work in your body.  Depression.  Trouble sleeping (insomnia). Week 3 and  afterward After the first 2-3 weeks of quitting, you may start to notice more positive results, such as:  Better sense of smell and taste.  Less coughing and sore throat.  Slower heart rate.  Lower blood pressure.  Clearer skin.  Better breathing.  Fewer sick days. Quitting smoking can be hard. Do not give up if you fail the first time. Some people need to try a few times before they succeed. Do your best to stick to your quit plan, and talk with your doctor if you have any questions or concerns. Summary  Smoking tobacco is the leading cause of preventable death. Quitting smoking can be hard, but it is one of the best things that you can do for your health.  When you decide to quit smoking, make a plan to help you succeed.  Quit smoking right away, not slowly over a period of time.  When you  start quitting, seek help from your doctor, family, or friends. This information is not intended to replace advice given to you by your health care provider. Make sure you discuss any questions you have with your health care provider. Document Revised: 04/28/2019 Document Reviewed: 10/22/2018 Elsevier Patient Education  2020 [employer].

## 2020-04-19 ENCOUNTER — Other Ambulatory Visit: Payer: Self-pay | Admitting: Cardiology

## 2020-04-19 MED ORDER — METOPROLOL TARTRATE 25 MG PO TABS: 25.0000 mg | ORAL_TABLET | Freq: Three times a day (TID) | ORAL | 2 refills | Status: DC | PRN

## 2020-04-19 NOTE — Telephone Encounter (Signed)
New message      *STAT* If patient is at the pharmacy, call can be transferred to refill team.   1. Which medications need to be refilled? (please list name of each medication and dose if known) metoprolol tartrate (LOPRESSOR) 25 MG tablet(Expired)  2. Which pharmacy/location (including street and city if local pharmacy) is medication to be sent to? [facility]  3. Do they need a 30 day or 90 day supply? 90

## 2020-04-19 NOTE — Telephone Encounter (Signed)
Refilled lopressor

## 2020-05-05 ENCOUNTER — Encounter (HOSPITAL_COMMUNITY): Payer: Self-pay | Admitting: *Deleted

## 2020-05-05 ENCOUNTER — Other Ambulatory Visit: Payer: Self-pay

## 2020-05-05 ENCOUNTER — Emergency Department (HOSPITAL_COMMUNITY): Payer: Medicaid Other

## 2020-05-05 DIAGNOSIS — F17210 Nicotine dependence, cigarettes, uncomplicated: Secondary | ICD-10-CM | POA: Diagnosis not present

## 2020-05-05 DIAGNOSIS — W19XXXA Unspecified fall, initial encounter: Secondary | ICD-10-CM | POA: Insufficient documentation

## 2020-05-05 DIAGNOSIS — Z7982 Long term (current) use of aspirin: Secondary | ICD-10-CM | POA: Insufficient documentation

## 2020-05-05 DIAGNOSIS — M25511 Pain in right shoulder: Secondary | ICD-10-CM | POA: Diagnosis present

## 2020-05-05 NOTE — ED Triage Notes (Signed)
Pt with tripped over his dog and fell landing on right shoulder.  C/o right shoulder pain.  [facility] states no deformity noted.

## 2020-05-06 ENCOUNTER — Emergency Department (HOSPITAL_COMMUNITY): Payer: Medicaid Other

## 2020-05-06 ENCOUNTER — Emergency Department (HOSPITAL_COMMUNITY)
Admission: EM | Admit: 2020-05-06 | Discharge: 2020-05-06 | Disposition: A | Discharge status: Home / Self Care | Payer: Medicaid Other | Attending: Emergency Medicine | Admitting: Emergency Medicine

## 2020-05-06 DIAGNOSIS — W19XXXA Unspecified fall, initial encounter: Secondary | ICD-10-CM

## 2020-05-06 DIAGNOSIS — S43014A Anterior dislocation of right humerus, initial encounter: Secondary | ICD-10-CM

## 2020-05-06 MED ORDER — FENTANYL CITRATE (PF) 100 MCG/2ML IJ SOLN
INTRAMUSCULAR | Status: AC
  Administered 2020-05-06: 100 ug
  Filled 2020-05-06: qty 2

## 2020-05-06 MED ORDER — FENTANYL CITRATE (PF) 100 MCG/2ML IJ SOLN: 100.0000 ug | Freq: Once | INTRAMUSCULAR | Status: AC
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
## 2020-06-19 ENCOUNTER — Ambulatory Visit: Payer: Medicaid Other | Admitting: Cardiology

## 2020-08-04 ENCOUNTER — Emergency Department (HOSPITAL_COMMUNITY): Payer: Medicaid Other

## 2020-08-04 ENCOUNTER — Encounter (HOSPITAL_COMMUNITY): Payer: Self-pay | Admitting: Emergency Medicine

## 2020-08-04 ENCOUNTER — Emergency Department (HOSPITAL_COMMUNITY)
Admission: EM | Admit: 2020-08-04 | Discharge: 2020-08-04 | Disposition: A | Discharge status: Home / Self Care | Payer: Medicaid Other | Attending: Emergency Medicine | Admitting: Emergency Medicine

## 2020-08-04 ENCOUNTER — Other Ambulatory Visit: Payer: Self-pay

## 2020-08-04 DIAGNOSIS — S43014A Anterior dislocation of right humerus, initial encounter: Secondary | ICD-10-CM

## 2020-08-04 DIAGNOSIS — Y33XXXA Other specified events, undetermined intent, initial encounter: Secondary | ICD-10-CM | POA: Diagnosis not present

## 2020-08-04 DIAGNOSIS — Z79899 Other long term (current) drug therapy: Secondary | ICD-10-CM | POA: Diagnosis not present

## 2020-08-04 DIAGNOSIS — F17210 Nicotine dependence, cigarettes, uncomplicated: Secondary | ICD-10-CM | POA: Insufficient documentation

## 2020-08-04 DIAGNOSIS — M25511 Pain in right shoulder: Secondary | ICD-10-CM | POA: Diagnosis present

## 2020-08-04 DIAGNOSIS — Z7982 Long term (current) use of aspirin: Secondary | ICD-10-CM | POA: Insufficient documentation

## 2020-08-04 DIAGNOSIS — R52 Pain, unspecified: Secondary | ICD-10-CM

## 2020-08-04 MED ORDER — PROPOFOL 10 MG/ML IV BOLUS
0.5000 mg/kg | Freq: Once | INTRAVENOUS | Status: AC
  Administered 2020-08-04: 44.9 mg via INTRAVENOUS
  Filled 2020-08-04: qty 20

## 2020-08-04 MED ORDER — ACETAMINOPHEN 500 MG PO TABS
1000.0000 mg | ORAL_TABLET | Freq: Once | ORAL | Status: AC
  Administered 2020-08-04: 1000 mg via ORAL
  Filled 2020-08-04: qty 2

## 2020-08-04 MED ORDER — PROPOFOL 10 MG/ML IV BOLUS
INTRAVENOUS | Status: AC | PRN
  Administered 2020-08-04 (×2): 50 mg via INTRAVENOUS

## 2020-08-04 NOTE — ED Triage Notes (Signed)
Pt fell trying to get in his wheelchair this morning, injuring his right arm . Pulse present.
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
## 2020-08-04 NOTE — ED Notes (Signed)
O2 removed.  BP 148/95, SR 94, Resp 14 and Sats 98% on RA.  No distress noted.  Resting quietly.

## 2020-08-04 NOTE — Discharge Instructions (Addendum)
It was our pleasure to provide your ER care today - we hope that you feel better.  Wear shoulder immobilizer. Icepack to sore area. Take acetaminophen or ibuprofen as need.    Follow up with orthopedist in the next 1-2 weeks - call office tomorrow AM arrange appointment.   Return to ER if worse, new symptoms, new or severe pain, numbness/weakness, or other concern.   You were given sedating medication in the ER - no driving for the next 12 hours.

## 2020-08-04 NOTE — ED Notes (Signed)
Resting quietly

## 2020-08-21 ENCOUNTER — Other Ambulatory Visit: Payer: Self-pay | Admitting: Cardiology

## 2020-08-26 ENCOUNTER — Ambulatory Visit: Payer: Medicaid Other | Admitting: Cardiology

## 2020-10-02 ENCOUNTER — Encounter: Payer: Self-pay | Admitting: Cardiology

## 2020-10-18 ENCOUNTER — Emergency Department (HOSPITAL_COMMUNITY)
Admission: EM | Admit: 2020-10-18 | Discharge: 2020-10-18 | Disposition: A | Discharge status: Home / Self Care | Payer: Medicaid Other | Attending: Emergency Medicine | Admitting: Emergency Medicine

## 2020-10-18 ENCOUNTER — Encounter (HOSPITAL_COMMUNITY): Payer: Self-pay | Admitting: *Deleted

## 2020-10-18 ENCOUNTER — Emergency Department (HOSPITAL_COMMUNITY): Payer: Medicaid Other

## 2020-10-18 ENCOUNTER — Other Ambulatory Visit: Payer: Self-pay

## 2020-10-18 DIAGNOSIS — S4991XA Unspecified injury of right shoulder and upper arm, initial encounter: Secondary | ICD-10-CM | POA: Diagnosis present

## 2020-10-18 DIAGNOSIS — X501XXA Overexertion from prolonged static or awkward postures, initial encounter: Secondary | ICD-10-CM | POA: Diagnosis not present

## 2020-10-18 DIAGNOSIS — Z7982 Long term (current) use of aspirin: Secondary | ICD-10-CM | POA: Insufficient documentation

## 2020-10-18 DIAGNOSIS — S43004A Unspecified dislocation of right shoulder joint, initial encounter: Secondary | ICD-10-CM | POA: Insufficient documentation

## 2020-10-18 DIAGNOSIS — F17210 Nicotine dependence, cigarettes, uncomplicated: Secondary | ICD-10-CM | POA: Diagnosis not present

## 2020-10-18 MED ORDER — PROPOFOL 10 MG/ML IV BOLUS
1.0000 mg/kg | Freq: Once | INTRAVENOUS | Status: AC
  Administered 2020-10-18: 90.7 mg via INTRAVENOUS
  Filled 2020-10-18: qty 20

## 2020-10-18 MED ORDER — PROPOFOL 10 MG/ML IV BOLUS
INTRAVENOUS | Status: AC | PRN
  Administered 2020-10-18 (×2): 50 mg via INTRAVENOUS

## 2020-10-18 MED ORDER — FENTANYL CITRATE (PF) 100 MCG/2ML IJ SOLN
50.0000 ug | Freq: Once | INTRAMUSCULAR | Status: AC
  Administered 2020-10-18: 50 ug via INTRAVENOUS
  Filled 2020-10-18: qty 2

## 2020-10-18 NOTE — ED Triage Notes (Signed)
States he popped his right shoulder out of place trying to put his coat on.

## 2020-10-18 NOTE — ED Notes (Signed)
Sling applied to patient right shoulder

## 2020-10-18 NOTE — Discharge Instructions (Addendum)
You may alternate taking Tylenol and Ibuprofen as needed for pain control. You may take 400-600 mg of ibuprofen every 6 hours and [phone] mg of Tylenol every 6 hours. Do not exceed 4000 mg of Tylenol daily as this can lead to liver damage. Also, make sure to take Ibuprofen with meals as it can cause an upset stomach. Do not take other NSAIDs while taking Ibuprofen such as (Aleve, Naprosyn, Aspirin, Celebrex, etc) and do not take more than the prescribed dose as this can lead to ulcers and bleeding in your GI tract. You may use warm and cold compresses to help with your symptoms.   Please follow up with the orthopedic doctor within the next 7-10 days for re-evaluation and further treatment of your symptoms.   Please return to the ER sooner if you have any new or worsening symptoms.
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
## 2020-10-18 NOTE — ED Notes (Signed)
Pt sitting talking in bed.

## 2020-11-08 ENCOUNTER — Ambulatory Visit: Payer: Medicaid Other | Admitting: Cardiology

## 2020-11-29 ENCOUNTER — Emergency Department (HOSPITAL_COMMUNITY): Payer: Medicaid Other

## 2020-11-29 ENCOUNTER — Encounter (HOSPITAL_COMMUNITY): Payer: Self-pay | Admitting: Emergency Medicine

## 2020-11-29 ENCOUNTER — Emergency Department (HOSPITAL_COMMUNITY)
Admission: EM | Admit: 2020-11-29 | Discharge: 2020-11-29 | Disposition: A | Discharge status: Home / Self Care | Payer: Medicaid Other | Attending: Emergency Medicine | Admitting: Emergency Medicine

## 2020-11-29 ENCOUNTER — Other Ambulatory Visit: Payer: Self-pay

## 2020-11-29 DIAGNOSIS — Z7982 Long term (current) use of aspirin: Secondary | ICD-10-CM | POA: Diagnosis not present

## 2020-11-29 DIAGNOSIS — F17210 Nicotine dependence, cigarettes, uncomplicated: Secondary | ICD-10-CM | POA: Diagnosis not present

## 2020-11-29 DIAGNOSIS — S4991XA Unspecified injury of right shoulder and upper arm, initial encounter: Secondary | ICD-10-CM | POA: Diagnosis present

## 2020-11-29 DIAGNOSIS — X58XXXA Exposure to other specified factors, initial encounter: Secondary | ICD-10-CM | POA: Diagnosis not present

## 2020-11-29 DIAGNOSIS — S43014A Anterior dislocation of right humerus, initial encounter: Secondary | ICD-10-CM | POA: Insufficient documentation

## 2020-11-29 MED ORDER — ONDANSETRON 4 MG PO TBDP: 4.0000 mg | ORAL_TABLET | Freq: Three times a day (TID) | ORAL | 0 refills | Status: DC | PRN

## 2020-11-29 MED ORDER — LEVOFLOXACIN 750 MG PO TABS: 750.0000 mg | ORAL_TABLET | Freq: Every day | ORAL | 0 refills | Status: DC

## 2020-11-29 MED ORDER — HYDROMORPHONE HCL 1 MG/ML IJ SOLN
1.0000 mg | Freq: Once | INTRAMUSCULAR | Status: AC
  Administered 2020-11-29: 1 mg via INTRAVENOUS
  Filled 2020-11-29: qty 1

## 2020-11-29 NOTE — ED Triage Notes (Signed)
Pt states he has a right shoulder dislocation.
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
## 2020-11-29 NOTE — Discharge Instructions (Signed)
You came to the emergency department today to be evaluated for your right shoulder dislocation.  Your shoulder was reduced and you were placed in a sling.  Please continue to wear the sling until you can be seen by an orthopedic specialist.  I have given you information to follow-up with Dr. August Saucer.  It is very important that you see this provider to help prevent this shoulder from dislocating more in the future.  Get help right away if: Your pain gets worse rather than better. You lose feeling in your arm or hand. Your arm or hand becomes white and cold.
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
## 2020-12-16 ENCOUNTER — Ambulatory Visit: Payer: Medicaid Other | Admitting: Physician Assistant

## 2020-12-16 DIAGNOSIS — I4892 Unspecified atrial flutter: Secondary | ICD-10-CM

## 2020-12-16 DIAGNOSIS — Z720 Tobacco use: Secondary | ICD-10-CM

## 2020-12-16 DIAGNOSIS — F1010 Alcohol abuse, uncomplicated: Secondary | ICD-10-CM

## 2020-12-16 DIAGNOSIS — I480 Paroxysmal atrial fibrillation: Secondary | ICD-10-CM
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
## 2021-01-14 ENCOUNTER — Ambulatory Visit: Payer: Medicaid Other | Admitting: Physician Assistant

## 2021-01-14 DIAGNOSIS — I4892 Unspecified atrial flutter: Secondary | ICD-10-CM

## 2021-01-14 DIAGNOSIS — Z720 Tobacco use: Secondary | ICD-10-CM

## 2021-01-14 DIAGNOSIS — F1010 Alcohol abuse, uncomplicated: Secondary | ICD-10-CM

## 2021-01-14 DIAGNOSIS — I480 Paroxysmal atrial fibrillation: Secondary | ICD-10-CM

## 2022-01-24 ENCOUNTER — Encounter (HOSPITAL_COMMUNITY): Payer: Self-pay | Admitting: Emergency Medicine

## 2022-01-24 ENCOUNTER — Emergency Department (HOSPITAL_COMMUNITY): Payer: Medicaid Other

## 2022-01-24 ENCOUNTER — Emergency Department (HOSPITAL_COMMUNITY)
Admission: EM | Admit: 2022-01-24 | Discharge: 2022-01-25 | Disposition: A | Discharge status: Home / Self Care | Payer: Medicaid Other | Attending: Emergency Medicine | Admitting: Emergency Medicine

## 2022-01-24 DIAGNOSIS — X501XXA Overexertion from prolonged static or awkward postures, initial encounter: Secondary | ICD-10-CM | POA: Diagnosis not present

## 2022-01-24 DIAGNOSIS — Z79899 Other long term (current) drug therapy: Secondary | ICD-10-CM | POA: Insufficient documentation

## 2022-01-24 DIAGNOSIS — S43004A Unspecified dislocation of right shoulder joint, initial encounter: Secondary | ICD-10-CM | POA: Insufficient documentation

## 2022-01-24 DIAGNOSIS — Z7982 Long term (current) use of aspirin: Secondary | ICD-10-CM | POA: Insufficient documentation

## 2022-01-24 DIAGNOSIS — S4991XA Unspecified injury of right shoulder and upper arm, initial encounter: Secondary | ICD-10-CM | POA: Diagnosis present

## 2022-01-24 MED ORDER — HYDROMORPHONE HCL 1 MG/ML IJ SOLN
1.0000 mg | Freq: Once | INTRAMUSCULAR | Status: AC
  Administered 2022-01-24: 1 mg via INTRAVENOUS
  Filled 2022-01-24: qty 1

## 2022-01-24 MED ORDER — ONDANSETRON HCL 4 MG/2ML IJ SOLN
4.0000 mg | Freq: Once | INTRAMUSCULAR | Status: AC
  Administered 2022-01-24: 4 mg via INTRAVENOUS
  Filled 2022-01-24: qty 2

## 2022-01-24 NOTE — ED Triage Notes (Signed)
Pt arrives c/o right shoulder dislocation. Pt has hx of same. States he was stretching and it popped out.

## 2022-01-25 ENCOUNTER — Emergency Department (HOSPITAL_COMMUNITY): Payer: Medicaid Other

## 2022-01-25 NOTE — ED Notes (Signed)
X-Ray at bedside.

## 2022-01-25 NOTE — ED Provider Notes (Signed)
[facility] EMERGENCY DEPARTMENT Provider Note   CSN: [id number] Arrival date & time: 01/24/22  2223     History  Chief Complaint  Patient presents with   Shoulder Dislocation    Henry [name] is a 61 y.o. male.  Patient presents to the emergency department for evaluation of shoulder dislocation.  Patient has had multiple right shoulder dislocations after a previous injury.  He reports that he was stretching with his arm above his head and it popped out.  Patient complaining of severe pain in the shoulder with some numbness of the hand.       Home Medications Prior to Admission medications   Medication Sig Start Date End Date Taking? Authorizing Provider  aspirin EC 81 MG EC tablet Take 1 tablet (81 mg total) by mouth daily. 05/10/19   Dorcas Carrow, MD  Cholecalciferol (D3 ADULT PO) Take 50 mcg by mouth daily.    [provider]  diltiazem (CARDIZEM CD) 360 MG 24 hr capsule Take 1 capsule (360 mg total) by mouth daily. 05/10/19 01/08/20  Dorcas Carrow, MD  folic acid (FOLVITE) 1 MG tablet Take 1 tablet (1 mg total) by mouth daily. 05/10/19   Dorcas Carrow, MD  furosemide (LASIX) 40 MG tablet Take 1 tablet (40 mg total) by mouth daily. 05/10/19   Dorcas Carrow, MD  hydrALAZINE (APRESOLINE) 25 MG tablet Take 25 mg by mouth daily. 10/25/19   [provider]  lactulose (CHRONULAC) 10 GM/15ML solution Take by mouth 3 (three) times daily.    [provider]  metoprolol tartrate (LOPRESSOR) 25 MG tablet TAKE (1) TABLET BY MOUTH EVERY EIGHT HOURS AS NEEDED FOR PALPITATIONS ORTACHYCARDIA. 08/21/20   Jonelle Sidle, MD  Multiple Vitamin (MULTIVITAMIN WITH MINERALS) TABS tablet Take 1 tablet by mouth daily. 05/10/19   Dorcas Carrow, MD  spironolactone (ALDACTONE) 50 MG tablet Take 1 tablet (50 mg total) by mouth daily. 05/10/19 01/08/20  Dorcas Carrow, MD  thiamine 100 MG tablet Take 100 mg by mouth daily.    [provider]  traZODone (DESYREL) 100 MG  tablet Take 100 mg by mouth at bedtime. 08/21/19   [provider]      Allergies    Patient has no known allergies.    Review of Systems   Review of Systems  Physical Exam Updated Vital Signs BP (!) 174/101   Pulse 85   Temp 98.4 F (36.9 C) (Oral)   Resp 20   Ht 5\' 8"  (1.727 m)   Wt 81.6 kg   SpO2 95%   BMI 27.37 kg/m  Physical Exam Constitutional:      General: He is not in acute distress.    Appearance: He is well-developed.  HENT:     Head: Normocephalic and atraumatic.     Mouth/Throat:     Mouth: Mucous membranes are moist.  Eyes:     General: Vision grossly intact. Gaze aligned appropriately.     Extraocular Movements: Extraocular movements intact.     Conjunctiva/sclera: Conjunctivae normal.  Cardiovascular:     Rate and Rhythm: Normal rate and regular rhythm.     Pulses: Normal pulses.     Heart sounds: Normal heart sounds, S1 normal and S2 normal. No murmur heard.    No friction rub. No gallop.  Pulmonary:     Effort: Pulmonary effort is normal. No respiratory distress.     Breath sounds: Normal breath sounds.  Abdominal:     Palpations: Abdomen is soft.  Tenderness: There is no abdominal tenderness. There is no guarding or rebound.     Hernia: No hernia is present.  Musculoskeletal:        General: No swelling.     Right shoulder: Deformity and tenderness present. Decreased range of motion.     Cervical back: Full passive range of motion without pain, normal range of motion and neck supple. No pain with movement, spinous process tenderness or muscular tenderness. Normal range of motion.     Right lower leg: No edema.     Left lower leg: No edema.  Skin:    General: Skin is warm and dry.     Capillary Refill: Capillary refill takes less than 2 seconds.     Findings: No ecchymosis, erythema, lesion or wound.  Neurological:     Mental Status: He is alert and oriented to person, place, and time.     GCS: GCS eye subscore is 4. GCS verbal  subscore is 5. GCS motor subscore is 6.     Cranial Nerves: Cranial nerves 2-12 are intact.     Sensory: Sensation is intact.     Motor: Motor function is intact. No weakness or abnormal muscle tone.     Coordination: Coordination is intact.  Psychiatric:        Mood and Affect: Mood normal.        Speech: Speech normal.        Behavior: Behavior normal.     ED Results / Procedures / Treatments   Labs (all labs ordered are listed, but only abnormal results are displayed) Labs Reviewed - No data to display  EKG None  Radiology DG Shoulder Right Portable  Result Date: 01/24/2022 CLINICAL DATA:  Right shoulder dislocation. EXAM: RIGHT SHOULDER - 1 VIEW COMPARISON:  November 29, 2020 FINDINGS: There is anterior dislocation of the right humeral head with respect to the right glenoid. No acute fractures identified. Moderate to marked severity degenerative changes are seen involving the right acromioclavicular joint. Soft tissues are unremarkable. IMPRESSION: Anterior dislocation of the right shoulder. Electronically Signed   By: Aram Candelahaddeus  Houston M.D.   On: 01/24/2022 23:13    Procedures .Ortho Injury Treatment  Date/Time: 01/25/2022 12:21 AM  Performed by: Gilda CreasePollina, Annarose Ouellet J, MD Authorized by: Gilda CreasePollina, Mercadies Co J, MD   Consent:    Consent obtained:  Verbal   Consent given by:  Patient   Risks discussed:  Fracture, nerve damage, restricted joint movement, vascular damage, irreducible dislocation and recurrent dislocation Universal protocol:    Procedure explained and questions answered to patient or proxy's satisfaction: yes     Relevant documents present and verified: yes     Test results available and properly labeled: yes     Imaging studies available: yes     Required blood products, implants, devices, and special equipment available: yes     Site/side marked: yes     Immediately prior to procedure a time out was called: yes     Patient identity confirmed:  Verbally with  patientInjury location: shoulder Location details: right shoulder Injury type: dislocation Dislocation type: anterior Hill-Sachs deformity: no Chronicity: recurrent Pre-procedure neurovascular assessment: neurovascularly intact Pre-procedure distal perfusion: normal Pre-procedure neurological function: normal Pre-procedure range of motion: reduced  Anesthesia: Local anesthesia used: no  Patient sedated: NoManipulation performed: yes Reduction method: external rotation and scapular manipulation Reduction successful: yes       Medications Ordered in ED Medications  HYDROmorphone (DILAUDID) injection 1 mg (1 mg Intravenous Given 01/24/22 2341)  ondansetron [facility]) injection 4 mg (4 mg Intravenous Given 01/24/22 2341)    ED Course/ Medical Decision Making/ A&P                           Medical Decision Making Amount and/or Complexity of Data Reviewed Radiology: ordered.  Risk Prescription drug management.   Presents to the emergency department for evaluation of shoulder dislocation.  Patient has had recurrent dislocations in the past.  X-ray confirmed dislocation.  Patient was reduced with analgesia, did not require sedation.        Final Clinical Impression(s) / ED Diagnoses Final diagnoses:  Shoulder dislocation, right, initial encounter    Rx / DC Orders ED Discharge Orders     None         Kroy Sprung, Canary Brim, MD 01/25/22 0023

## 2023-04-06 IMAGING — DX DG SHOULDER 1V*R*
2 series · 2 of 2 positions shown · non-contrast
Comparison: None Available.

CLINICAL DATA: Reduction of shoulder dislocation

EXAM:
RIGHT SHOULDER - 1 VIEW

[shoulder obl]
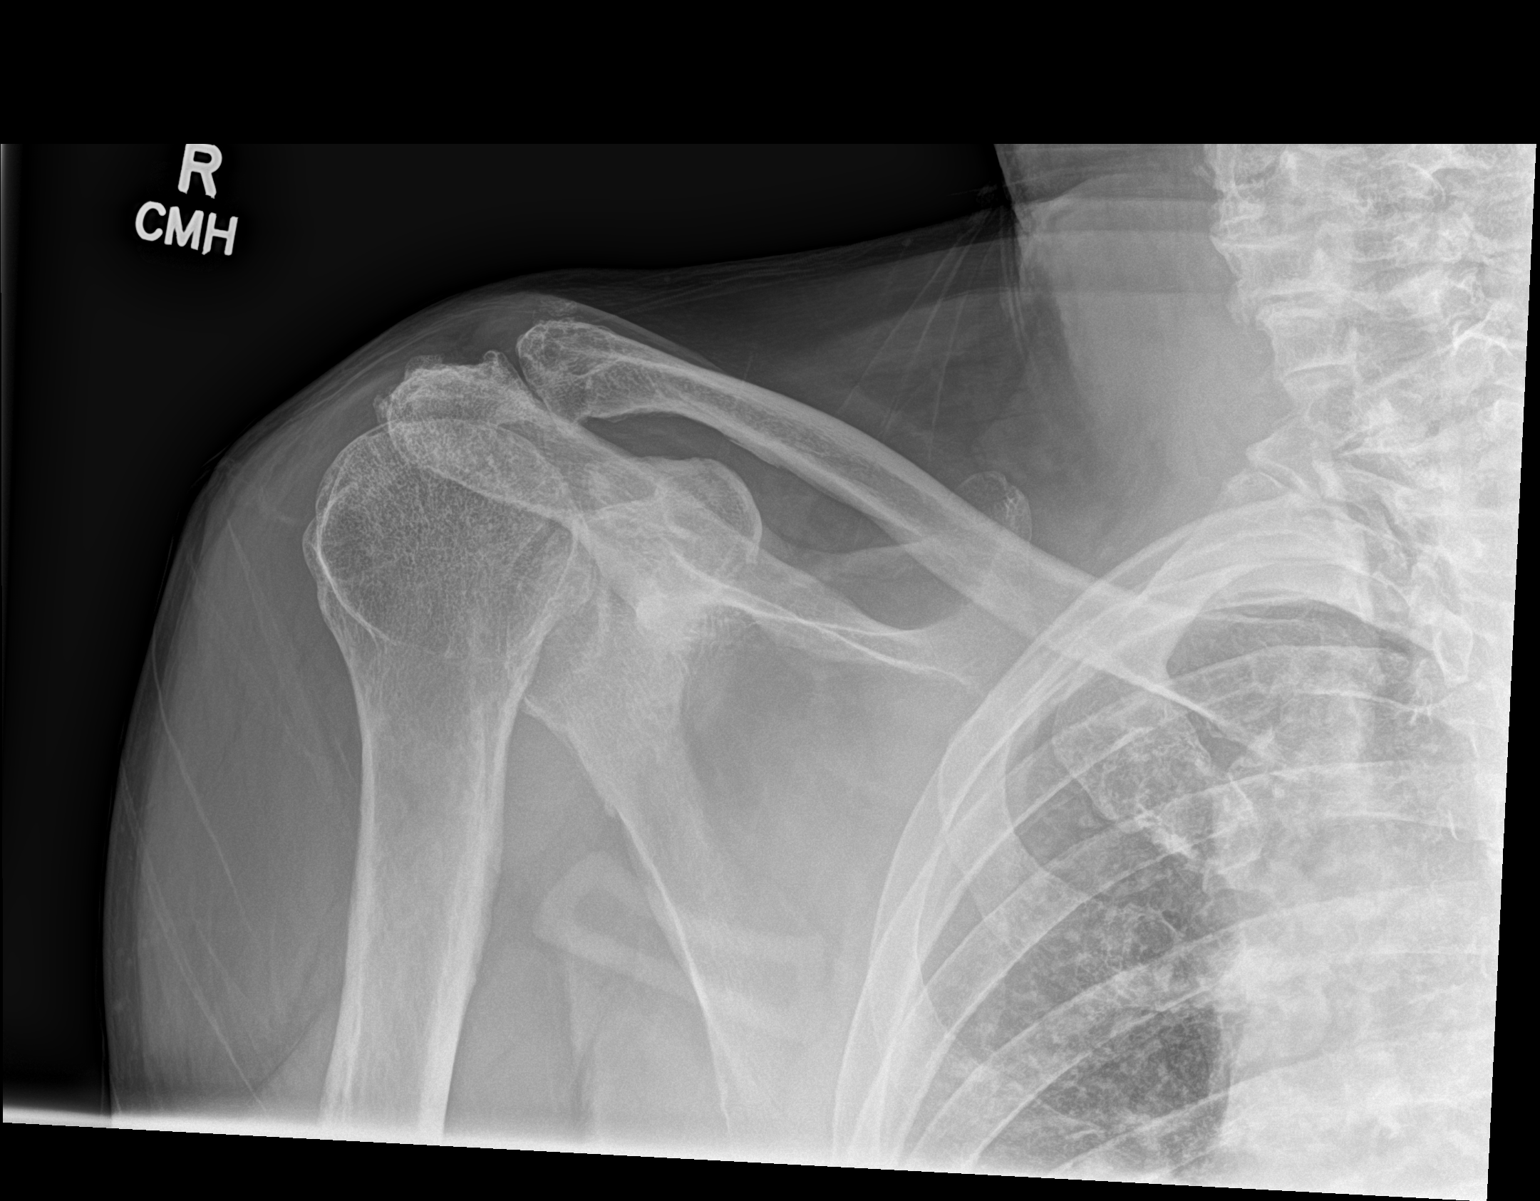

[shoulder y view]
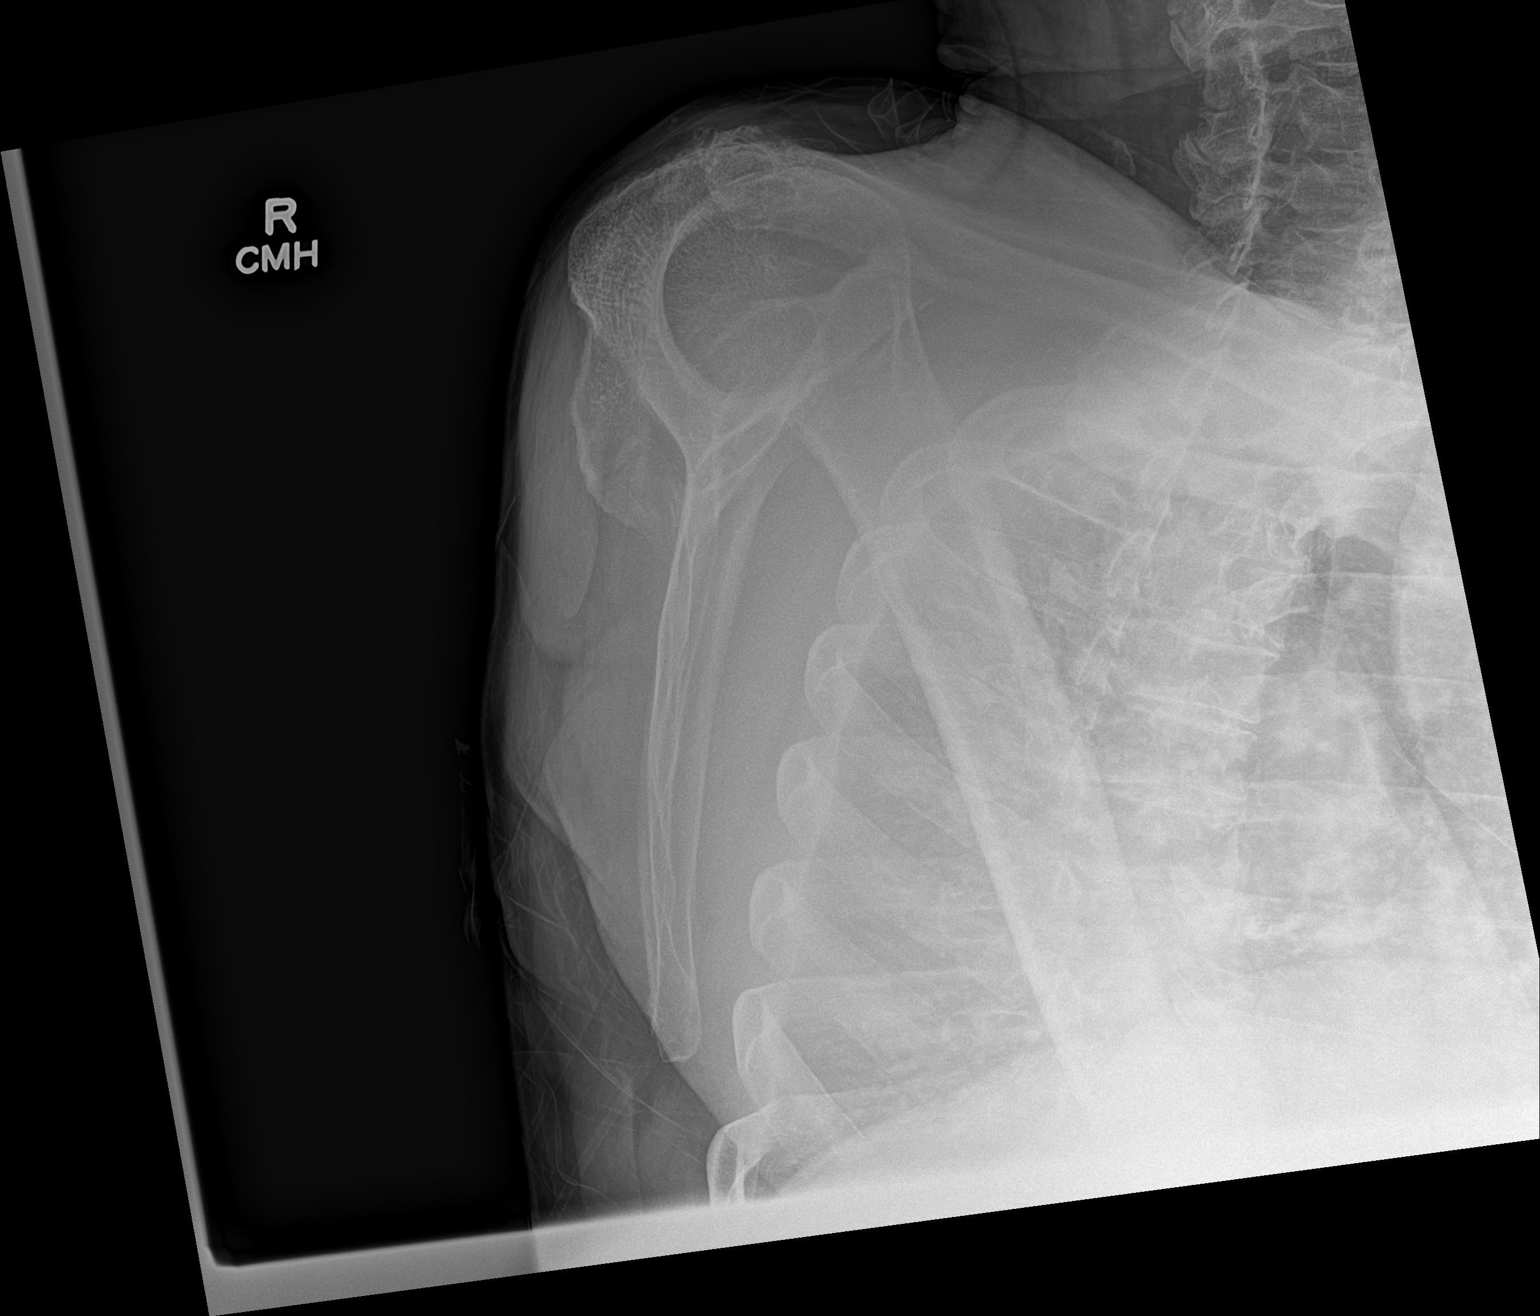

[2 of 2 positions shown; findings below may reference images not displayed]

FINDINGS: Alignment is improved at the right glenohumeral joint but it is not
possible to say whether the dislocation is completely reduced given
the positioning.
IMPRESSION: Improved alignment of the right glenohumeral joint. Assessment
otherwise limited by positioning.

## 2023-07-23 ENCOUNTER — Emergency Department (HOSPITAL_COMMUNITY)
Admission: EM | Admit: 2023-07-23 | Discharge: 2023-07-23 | Disposition: A | Discharge status: Home / Self Care | Payer: Medicaid Other | Attending: Emergency Medicine | Admitting: Emergency Medicine

## 2023-07-23 ENCOUNTER — Emergency Department (HOSPITAL_COMMUNITY): Payer: Medicaid Other

## 2023-07-23 ENCOUNTER — Encounter (HOSPITAL_COMMUNITY): Payer: Self-pay

## 2023-07-23 ENCOUNTER — Other Ambulatory Visit: Payer: Self-pay

## 2023-07-23 DIAGNOSIS — S43004A Unspecified dislocation of right shoulder joint, initial encounter: Secondary | ICD-10-CM | POA: Insufficient documentation

## 2023-07-23 DIAGNOSIS — W1839XA Other fall on same level, initial encounter: Secondary | ICD-10-CM | POA: Diagnosis not present

## 2023-07-23 DIAGNOSIS — S4991XA Unspecified injury of right shoulder and upper arm, initial encounter: Secondary | ICD-10-CM | POA: Diagnosis present

## 2023-07-23 DIAGNOSIS — Z7982 Long term (current) use of aspirin: Secondary | ICD-10-CM | POA: Diagnosis not present

## 2023-07-23 MED ORDER — HYDROMORPHONE HCL 1 MG/ML IJ SOLN
1.0000 mg | Freq: Once | INTRAMUSCULAR | Status: AC
  Administered 2023-07-23: 1 mg via INTRAVENOUS
  Filled 2023-07-23: qty 1

## 2023-07-23 NOTE — Discharge Instructions (Addendum)
You have been seen today for your complaint of right shoulder dislocation. Your imaging confirms shoulder dislocation.  Postreduction confirms good placement. Your discharge medications include Alternate tylenol and ibuprofen for pain. You may alternate these every 4 hours. You may take up to 800 mg of ibuprofen at a time and up to 1000 mg of tylenol. Home care instructions are as follows:  Wear the shoulder immobilizer until you are able to follow-up with orthopedics.  Avoid overhead activities Follow up with: Dr. Romeo Apple.  He is an Investment banker, operational.  Call to schedule a follow-up appointment Please seek immediate medical care if you develop any of the following symptoms: Your pain gets worse, not better. You lose feeling in your arm or hand. Your arm or hand turns white and cold. At this time there does not appear to be the presence of an emergent medical condition, however there is always the potential for conditions to change. Please read and follow the below instructions.  Do not take your medicine if  develop an itchy rash, swelling in your mouth or lips, or difficulty breathing; call 911 and seek immediate emergency medical attention if this occurs.  You may review your lab tests and imaging results in their entirety on your MyChart account.  Please discuss all results of fully with your primary care provider and other specialist at your follow-up visit.  Note: Portions of this text may have been transcribed using voice recognition software. Every effort was made to ensure accuracy; however, inadvertent computerized transcription errors may still be present.

## 2023-07-23 NOTE — ED Triage Notes (Signed)
Pt reports:  Right arm deformity "It's dislocated." Was chopping wood

## 2023-07-23 NOTE — ED Provider Notes (Signed)
[facility] AT [facility] Provider Note   CSN: [id number] Arrival date & time: 07/23/23  1544     History  No chief complaint on file.   Henry [name] is a 62 y.o. male.  With history of alcoholic cirrhosis, A-fib presenting to the ED for evaluation of right shoulder dislocation.  This has happened multiple times in the past.  States he was bending forward to pick up a piece of wood when he lost his balance and fell backwards.  He then pulled up on a piece of wood which dislocated his shoulder.  He attempted to reduce the shoulder at home unsuccessfully.  He reports moderate amount of pain to the right shoulder.  Denies any numbness, weakness or tingling.  No loss of grip strength.  HPI     Home Medications Prior to Admission medications   Medication Sig Start Date End Date Taking? Authorizing Provider  aspirin EC 81 MG EC tablet Take 1 tablet (81 mg total) by mouth daily. 05/10/19   Dorcas Carrow, MD  Cholecalciferol (D3 ADULT PO) Take 50 mcg by mouth daily.    [provider]  diltiazem (CARDIZEM CD) 360 MG 24 hr capsule Take 1 capsule (360 mg total) by mouth daily. 05/10/19 01/08/20  Dorcas Carrow, MD  folic acid (FOLVITE) 1 MG tablet Take 1 tablet (1 mg total) by mouth daily. 05/10/19   Dorcas Carrow, MD  furosemide (LASIX) 40 MG tablet Take 1 tablet (40 mg total) by mouth daily. 05/10/19   Dorcas Carrow, MD  hydrALAZINE (APRESOLINE) 25 MG tablet Take 25 mg by mouth daily. 10/25/19   [provider]  lactulose (CHRONULAC) 10 GM/15ML solution Take by mouth 3 (three) times daily.    [provider]  metoprolol tartrate (LOPRESSOR) 25 MG tablet TAKE (1) TABLET BY MOUTH EVERY EIGHT HOURS AS NEEDED FOR PALPITATIONS ORTACHYCARDIA. 08/21/20   Jonelle Sidle, MD  Multiple Vitamin (MULTIVITAMIN WITH MINERALS) TABS tablet Take 1 tablet by mouth daily. 05/10/19   Dorcas Carrow, MD  spironolactone (ALDACTONE) 50 MG tablet Take 1 tablet  (50 mg total) by mouth daily. 05/10/19 01/08/20  Dorcas Carrow, MD  thiamine 100 MG tablet Take 100 mg by mouth daily.    [provider]  traZODone (DESYREL) 100 MG tablet Take 100 mg by mouth at bedtime. 08/21/19   [provider]      Allergies    Patient has no known allergies.    Review of Systems   Review of Systems  Musculoskeletal:  Positive for arthralgias.  All other systems reviewed and are negative.   Physical Exam Updated Vital Signs BP (!) 189/109   Pulse 89   Temp 98 F (36.7 C) (Oral)   Resp 20   Ht 5\' 8"  (1.727 m)   Wt 90.7 kg   SpO2 93%   BMI 30.41 kg/m  Physical Exam Vitals and nursing note reviewed.  Constitutional:      General: He is not in acute distress.    Appearance: Normal appearance. He is normal weight. He is not ill-appearing.  HENT:     Head: Normocephalic and atraumatic.  Pulmonary:     Effort: Pulmonary effort is normal. No respiratory distress.  Abdominal:     General: Abdomen is flat.  Musculoskeletal:     Cervical back: Neck supple.     Comments: Obvious deformity of right shoulder with step off. Grip strength 5/5 bilaterally. Sensation intact in all digits bilaterally. Cap refill normal.  Skin:    General: Skin is warm and dry.  Neurological:     Mental Status: He is alert and oriented to person, place, and time.  Psychiatric:        Mood and Affect: Mood normal.        Behavior: Behavior normal.     ED Results / Procedures / Treatments   Labs (all labs ordered are listed, but only abnormal results are displayed) Labs Reviewed - No data to display  EKG None  Radiology DG Shoulder Right Portable  Result Date: 07/23/2023 CLINICAL DATA:  Reduction of shoulder dislocation EXAM: RIGHT SHOULDER - 1 VIEW COMPARISON:  07/23/2023 FINDINGS: Frontal and transscapular views of the right shoulder are obtained. There is anatomic alignment of the glenohumeral joint. Severe acromioclavicular joint osteoarthritis is  noted. Stable spurring of the undersurface of the acromion process. No evidence of displaced fracture. Soft tissues are unremarkable. Right chest is clear. IMPRESSION: 1. Reduction of right shoulder dislocation, with anatomic alignment of the glenohumeral joint. 2. Stable degenerative changes.  No acute fracture. Electronically Signed   By: Sharlet Salina M.D.   On: 07/23/2023 20:07   DG Shoulder Right  Result Date: 07/23/2023 CLINICAL DATA:  Right arm injury with deformity. EXAM: RIGHT SHOULDER - 2+ VIEW COMPARISON:  Radiographs 01/25/2022. FINDINGS: Recurrent anterior inferior glenohumeral dislocation without definite associated acute fracture. Underlying mild glenohumeral and acromioclavicular degenerative changes. IMPRESSION: Recurrent anterior inferior glenohumeral dislocation. Electronically Signed   By: Carey Bullocks M.D.   On: 07/23/2023 17:22    Procedures .Reduction of dislocation  Date/Time: 07/23/2023 7:37 PM  Performed by: Michelle Piper, PA-C Authorized by: Michelle Piper, PA-C  Consent: Verbal consent obtained. Consent given by: patient Patient understanding: patient states understanding of the procedure being performed Patient consent: the patient's understanding of the procedure matches consent given Procedure consent: procedure consent matches procedure scheduled Relevant documents: relevant documents present and verified Test results: test results available and properly labeled Site marked: the operative site was marked Imaging studies: imaging studies available Patient identity confirmed: verbally with patient Time out: Immediately prior to procedure a "time out" was called to verify the correct patient, procedure, equipment, support staff and site/side marked as required. Local anesthesia used: no  Anesthesia: Local anesthesia used: no  Sedation: Patient sedated: no  Patient tolerance: patient tolerated the procedure well with no immediate  complications Comments: Close reduction of right shoulder using analgesia       Medications Ordered in ED Medications  HYDROmorphone (DILAUDID) injection 1 mg (1 mg Intravenous Given 07/23/23 1903)    ED Course/ Medical Decision Making/ A&P                                 Medical Decision Making Amount and/or Complexity of Data Reviewed Radiology: ordered.  Risk Prescription drug management.  This patient presents to the ED for concern of right shoulder pain, dislocation, this involves an extensive number of treatment options, and is a complaint that carries with it a high risk of complications and morbidity.  The differential diagnosis includes dislocation, fracture, strain, sprain  My initial workup includes imaging, pain control, reduction, postreduction imaging  Additional history obtained from: Nursing notes from this visit. ED visits for same on 01/24/2022, 11/29/2020, 10/18/2020, 08/04/2020, 05/06/2020  I ordered imaging studies including x-ray right shoulder, pre and postreduction I independently visualized and interpreted imaging which showed anterior inferior dislocation I agree with  the radiologist interpretation  Afebrile, hypertensive but otherwise hemodynamically stable.  62 year old male presenting for evaluation of right shoulder dislocation.  He has a history of similar.  He reports moderate pain.  No numbness, weakness or tingling.  X-ray imaging confirms anterior-inferior dislocation without acute fracture.  Reduction was performed using analgesia.  Patient tolerated well.  He was immediately placed in a shoulder immobilizer.  Neurovascular status intact pre and postreduction.  He was given contact information for orthopedics and encouraged to follow-up regarding his recurrent shoulder dislocations.  He was given return precautions.  Stable at discharge.  At this time there does not appear to be any evidence of an acute emergency medical condition and the patient  appears stable for discharge with appropriate outpatient follow up. Diagnosis was discussed with patient who verbalizes understanding of care plan and is agreeable to discharge. I have discussed return precautions with patient who verbalizes understanding. Patient encouraged to follow-up with their PCP within 1 week. All questions answered.  Patient's case discussed with Dr. Estell Harpin who agrees with plan to discharge with follow-up.   Note: Portions of this report may have been transcribed using voice recognition software. Every effort was made to ensure accuracy; however, inadvertent computerized transcription errors may still be present.         Final Clinical Impression(s) / ED Diagnoses Final diagnoses:  Dislocation of right shoulder joint, initial encounter    Rx / DC Orders ED Discharge Orders     None         Mora Bellman 07/23/23 2032    Bethann Berkshire, MD 07/25/23 1241
